# Patient Record
Sex: Male | Born: 1965 | Race: Black or African American | Hispanic: No | Marital: Married | Smoking: Current every day smoker
Health system: Southern US, Community
[De-identification: ages and names within clinical notes are randomized; demographics above are authoritative.]

## PROBLEM LIST (undated history)

## (undated) DIAGNOSIS — I10 Essential (primary) hypertension: Secondary | ICD-10-CM

## (undated) DIAGNOSIS — E782 Mixed hyperlipidemia: Secondary | ICD-10-CM

## (undated) DIAGNOSIS — E669 Obesity, unspecified: Secondary | ICD-10-CM

## (undated) DIAGNOSIS — K219 Gastro-esophageal reflux disease without esophagitis: Secondary | ICD-10-CM

## (undated) DIAGNOSIS — F17200 Nicotine dependence, unspecified, uncomplicated: Secondary | ICD-10-CM

## (undated) DIAGNOSIS — Z973 Presence of spectacles and contact lenses: Secondary | ICD-10-CM

## (undated) DIAGNOSIS — E785 Hyperlipidemia, unspecified: Secondary | ICD-10-CM

## (undated) DIAGNOSIS — E119 Type 2 diabetes mellitus without complications: Secondary | ICD-10-CM

## (undated) DIAGNOSIS — R809 Proteinuria, unspecified: Secondary | ICD-10-CM

## (undated) HISTORY — DX: Gastro-esophageal reflux disease without esophagitis: K21.9

## (undated) HISTORY — DX: Nicotine dependence, unspecified, uncomplicated: F17.200

## (undated) HISTORY — DX: Mixed hyperlipidemia: E78.2

## (undated) HISTORY — PX: WISDOM TOOTH EXTRACTION: SHX21

## (undated) HISTORY — DX: Obesity, unspecified: E66.9

## (undated) HISTORY — DX: Hyperlipidemia, unspecified: E78.5

## (undated) HISTORY — DX: Proteinuria, unspecified: R80.9

## (undated) HISTORY — DX: Presence of spectacles and contact lenses: Z97.3

---

## 2008-07-16 DIAGNOSIS — E119 Type 2 diabetes mellitus without complications: Secondary | ICD-10-CM

## 2008-07-16 DIAGNOSIS — I10 Essential (primary) hypertension: Secondary | ICD-10-CM

## 2008-07-16 HISTORY — DX: Type 2 diabetes mellitus without complications: E11.9

## 2008-07-16 HISTORY — DX: Essential (primary) hypertension: I10

## 2013-02-05 ENCOUNTER — Encounter (HOSPITAL_COMMUNITY): Payer: Self-pay | Admitting: Adult Health

## 2013-02-05 ENCOUNTER — Emergency Department (HOSPITAL_COMMUNITY)
Admission: EM | Admit: 2013-02-05 | Discharge: 2013-02-05 | Disposition: A | Discharge status: Home / Self Care | Payer: BC Managed Care – PPO | Attending: Emergency Medicine | Admitting: Emergency Medicine

## 2013-02-05 DIAGNOSIS — E119 Type 2 diabetes mellitus without complications: Secondary | ICD-10-CM | POA: Insufficient documentation

## 2013-02-05 DIAGNOSIS — Z79899 Other long term (current) drug therapy: Secondary | ICD-10-CM | POA: Insufficient documentation

## 2013-02-05 DIAGNOSIS — F17200 Nicotine dependence, unspecified, uncomplicated: Secondary | ICD-10-CM | POA: Insufficient documentation

## 2013-02-05 DIAGNOSIS — K089 Disorder of teeth and supporting structures, unspecified: Secondary | ICD-10-CM | POA: Insufficient documentation

## 2013-02-05 DIAGNOSIS — K0889 Other specified disorders of teeth and supporting structures: Secondary | ICD-10-CM

## 2013-02-05 HISTORY — DX: Type 2 diabetes mellitus without complications: E11.9

## 2013-02-05 MED ORDER — HYDROCODONE-ACETAMINOPHEN 5-325 MG PO TABS: 1.0000 | ORAL_TABLET | ORAL | Status: DC | PRN

## 2013-02-05 NOTE — ED Notes (Signed)
Presents post fillling this afternoon in upper left teeth, reports pain still.

## 2013-02-05 NOTE — ED Provider Notes (Signed)
CSN: [id number]     Arrival date & time 02/05/13  2000 History  This chart was scribed for Trixie Dredge, PA-C working with Leonette Most B. Bernette Mayers, MD by Greggory Stallion, ED scribe. This patient was seen in room TR07C/TR07C and the patient's care was started at 9:12 PM.   Chief Complaint  Patient presents with  . Dental Pain   The history is provided by the patient. No language interpreter was used.    HPI Comments: Jeffrey Rodriguez is a 47 y.o. male who presents to the Emergency Department complaining of gradual onset, constant left upper throbbing dental pain that started this afternoon after he had a filling. He rates his pain 6/10. Pt states the swelling went away but he still has the pain. He states he has tried Orajel and 800mg  ibuprofen with no relief. Pt denies fevers, trouble swallowing, SOB, and sore throat as associated symptoms. Dentist closed early today, he did not report the problem to her.  She did not give him a prescription for pain medication.   Dentist is Lorenza Burton DDS  Past Medical History  Diagnosis Date  . Diabetes mellitus without complication    History reviewed. No pertinent past surgical history. History reviewed. No pertinent family history. History  Substance Use Topics  . Smoking status: Current Every Day Smoker    Types: Cigarettes  . Smokeless tobacco: Not on file  . Alcohol Use: No    Review of Systems  Constitutional: Negative for fever.  HENT: Positive for dental problem. Negative for sore throat and trouble swallowing.   Respiratory: Negative for shortness of breath.     Allergies  Review of patient's allergies indicates no known allergies.  Home Medications   Current Outpatient Rx  Name  Route  Sig  Dispense  Refill  . glipiZIDE (GLUCOTROL XL) 10 MG 24 hr tablet   Oral   Take 10 mg by mouth 2 (two) times daily. Take twice a day per patient         . hydrochlorothiazide (HYDRODIURIL) 25 MG tablet   Oral   Take 25 mg by mouth daily.          Marland Kitchen lisinopril (PRINIVIL,ZESTRIL) 20 MG tablet   Oral   Take 20 mg by mouth daily.         . metFORMIN (GLUCOPHAGE) 500 MG tablet   Oral   Take 500 mg by mouth 2 (two) times daily with a meal.          BP 144/95  Pulse 98  Temp(Src) 98.8 F (37.1 C) (Oral)  Resp 18  SpO2 97%  Physical Exam  Nursing note and vitals reviewed. Constitutional: He appears well-developed and well-nourished. No distress.  HENT:  Mouth/Throat: Oropharynx is clear and moist. No oropharyngeal exudate.    Pulmonary/Chest: Effort normal.  Abdominal: Soft.  Lymphadenopathy:    He has no cervical adenopathy.  Neurological: He is alert.  Skin: He is not diaphoretic.    ED Course   Procedures (including critical care time)  DIAGNOSTIC STUDIES: Oxygen Saturation is 97% on RA, normal by my interpretation.    COORDINATION OF CARE: 9:29 PM-Discussed treatment plan which includes pain medication with pt at bedside and pt agreed to plan. Advised his pt to follow up with his dentist on Monday.   Labs Reviewed - No data to display No results found. 1. Pain, dental     MDM  Pt with dental work done today, d/c home without pain medication, continues to have throbbing  pain in tooth.  No e/o infection.  DEA database shows no presciptions in the past 6 months for this patient.  Pt d/c home with #10 norco and dental follow up. Discussed  findings, treatment, follow up  with patient.  Pt given return precautions.  Pt verbalizes understanding and agrees with plan.      I doubt any other EMC precluding discharge at this time including, but not necessarily limited to the following: dental abscess, deep space infection of head or neck    I personally performed the services described in this documentation, which was scribed in my presence. The recorded information has been reviewed and is accurate.   Trixie Dredge, PA-C 02/05/13 2205

## 2013-02-05 NOTE — ED Provider Notes (Signed)
Medical screening examination/treatment/procedure(s) were performed by non-physician practitioner and as supervising physician I was immediately available for consultation/collaboration.   Charles B. Bernette Mayers, MD 02/05/13 2322

## 2013-03-01 ENCOUNTER — Emergency Department (HOSPITAL_COMMUNITY)
Admission: EM | Admit: 2013-03-01 | Discharge: 2013-03-01 | Disposition: A | Discharge status: Home / Self Care | Payer: BC Managed Care – PPO | Source: Home / Self Care

## 2013-03-01 ENCOUNTER — Encounter (HOSPITAL_COMMUNITY): Payer: Self-pay | Admitting: *Deleted

## 2013-03-01 DIAGNOSIS — K089 Disorder of teeth and supporting structures, unspecified: Secondary | ICD-10-CM

## 2013-03-01 DIAGNOSIS — K0889 Other specified disorders of teeth and supporting structures: Secondary | ICD-10-CM

## 2013-03-01 HISTORY — DX: Essential (primary) hypertension: I10

## 2013-03-01 MED ORDER — CLINDAMYCIN HCL 150 MG PO CAPS: 150.0000 mg | ORAL_CAPSULE | Freq: Four times a day (QID) | ORAL | Status: DC

## 2013-03-01 MED ORDER — HYDROCODONE-ACETAMINOPHEN 5-325 MG PO TABS: 1.0000 | ORAL_TABLET | Freq: Four times a day (QID) | ORAL | Status: DC | PRN

## 2013-03-01 NOTE — ED Provider Notes (Signed)
Jeffrey Rodriguez is a 47 y.o. male who presents to Urgent Care today for dental pain. Patient has pain in his upper left tooth. He had a cavity filled on 8/4 however his pain persisted and this demonstrated him with clindamycin and hydrocodone. Patient notes that the clindamycin worked very well however he ran out of his antibiotics recently and his pain has returned. He is an appointment scheduled with the dentist soon for dental extraction versus root canal. He would like his antibiotics and pain medications refilled if possible. No fevers or chills nausea vomiting or diarrhea.   PMH reviewed. Healthy otherwise History  Substance Use Topics  . Smoking status: Former Smoker    Types: Cigarettes  . Smokeless tobacco: Not on file  . Alcohol Use: No   ROS as above Medications reviewed. No current facility-administered medications for this encounter.   Current Outpatient Prescriptions  Medication Sig Dispense Refill  . glipiZIDE (GLUCOTROL XL) 10 MG 24 hr tablet Take 10 mg by mouth 2 (two) times daily. Take twice a day per patient      . hydrochlorothiazide (HYDRODIURIL) 25 MG tablet Take 25 mg by mouth daily.      Marland Kitchen lisinopril (PRINIVIL,ZESTRIL) 20 MG tablet Take 20 mg by mouth daily.      . metFORMIN (GLUCOPHAGE) 500 MG tablet Take 500 mg by mouth 2 (two) times daily with a meal.      . clindamycin (CLEOCIN) 150 MG capsule Take 1 capsule (150 mg total) by mouth every 6 (six) hours.  80 capsule  0  . HYDROcodone-acetaminophen (NORCO/VICODIN) 5-325 MG per tablet Take 1 tablet by mouth every 6 (six) hours as needed for pain.  20 tablet  0    Exam:  BP 125/81  Pulse 86  Temp(Src) 98.5 F (36.9 C) (Oral)  Resp 18  SpO2 99% Gen: Well NAD HEENT: EOMI,  MMM, this seemed to the upper left with neighboring tooth with erythema  in the gumline. tender to palpation Lungs: CTABL Nl WOB Heart: RRR no MRG Abd: NABS, NT, ND Exts: Non edematous BL  LE, warm and well perfused.   No results found for  this or any previous visit (from the past 24 hour(s)). No results found.  Assessment and Plan: 47 y.o. male with dental pain.  Place patient back on antibiotics. Small amount of hydrocodone for pain. Followup with dentist ASAP. Discussed warning signs or symptoms. Please see discharge instructions. Patient expresses understanding.      Rodolph Bong, MD 03/01/13 [phone]

## 2013-03-01 NOTE — ED Notes (Signed)
C/O left upper toothache since Friday; finished clindamycin course on Friday following tooth filling 8/4 (placed on abx per dentist 8/6) due to continued pain.  Has been taking Advil and hydrocodone.

## 2013-06-09 ENCOUNTER — Other Ambulatory Visit: Payer: Self-pay | Admitting: Family Medicine

## 2013-07-30 ENCOUNTER — Other Ambulatory Visit: Payer: Self-pay | Admitting: Family Medicine

## 2013-08-27 ENCOUNTER — Other Ambulatory Visit: Payer: Self-pay | Admitting: Family Medicine

## 2013-08-30 ENCOUNTER — Other Ambulatory Visit: Payer: Self-pay | Admitting: Family Medicine

## 2013-11-13 DIAGNOSIS — R809 Proteinuria, unspecified: Secondary | ICD-10-CM

## 2013-11-13 HISTORY — DX: Proteinuria, unspecified: R80.9

## 2013-11-19 ENCOUNTER — Ambulatory Visit (INDEPENDENT_AMBULATORY_CARE_PROVIDER_SITE_OTHER): Payer: BC Managed Care – PPO | Admitting: Medical

## 2013-11-19 ENCOUNTER — Encounter: Payer: Self-pay | Admitting: Medical

## 2013-11-19 VITALS — BP 138/90 | HR 88 | Temp 98.5°F | Resp 18 | Ht 72.0 in | Wt 231.0 lb

## 2013-11-19 DIAGNOSIS — Z0000 Encounter for general adult medical examination without abnormal findings: Secondary | ICD-10-CM

## 2013-11-19 DIAGNOSIS — F17200 Nicotine dependence, unspecified, uncomplicated: Secondary | ICD-10-CM

## 2013-11-19 DIAGNOSIS — I10 Essential (primary) hypertension: Secondary | ICD-10-CM

## 2013-11-19 DIAGNOSIS — Z23 Encounter for immunization: Secondary | ICD-10-CM

## 2013-11-19 DIAGNOSIS — E119 Type 2 diabetes mellitus without complications: Secondary | ICD-10-CM

## 2013-11-19 DIAGNOSIS — Z1211 Encounter for screening for malignant neoplasm of colon: Secondary | ICD-10-CM

## 2013-11-19 DIAGNOSIS — J069 Acute upper respiratory infection, unspecified: Secondary | ICD-10-CM

## 2013-11-19 LAB — COMPREHENSIVE METABOLIC PANEL
ALBUMIN: 4.2 g/dL (ref 3.5–5.2)
ALT: 32 U/L (ref 0–53)
AST: 34 U/L (ref 0–37)
Alkaline Phosphatase: 88 U/L (ref 39–117)
BUN: 13 mg/dL (ref 6–23)
CALCIUM: 9.1 mg/dL (ref 8.4–10.5)
CHLORIDE: 106 meq/L (ref 96–112)
CO2: 27 meq/L (ref 19–32)
Creat: 0.95 mg/dL (ref 0.50–1.35)
GLUCOSE: 130 mg/dL — AB (ref 70–99)
POTASSIUM: 4 meq/L (ref 3.5–5.3)
SODIUM: 142 meq/L (ref 135–145)
TOTAL PROTEIN: 7.4 g/dL (ref 6.0–8.3)
Total Bilirubin: 0.4 mg/dL (ref 0.2–1.2)

## 2013-11-19 LAB — CBC
HEMATOCRIT: 42.2 % (ref 39.0–52.0)
HEMOGLOBIN: 15.3 g/dL (ref 13.0–17.0)
MCH: 28.9 pg (ref 26.0–34.0)
MCHC: 36.3 g/dL — AB (ref 30.0–36.0)
MCV: 79.8 fL (ref 78.0–100.0)
Platelets: 219 10*3/uL (ref 150–400)
RBC: 5.29 MIL/uL (ref 4.22–5.81)
RDW: 14.4 % (ref 11.5–15.5)
WBC: 7.4 10*3/uL (ref 4.0–10.5)

## 2013-11-19 LAB — POCT URINALYSIS DIPSTICK
BILIRUBIN UA: NEGATIVE
KETONES UA: NEGATIVE
LEUKOCYTES UA: NEGATIVE
Nitrite, UA: NEGATIVE
SPEC GRAV UA: 1.02
Urobilinogen, UA: NEGATIVE
pH, UA: 5

## 2013-11-19 LAB — LIPID PANEL
CHOL/HDL RATIO: 3.8 ratio
Cholesterol: 118 mg/dL (ref 0–200)
HDL: 31 mg/dL — ABNORMAL LOW (ref >39)
LDL Cholesterol: 44 mg/dL (ref 0–99)
Triglycerides: 215 mg/dL — ABNORMAL HIGH (ref <150)
VLDL: 43 mg/dL — ABNORMAL HIGH (ref 0–40)

## 2013-11-19 MED ORDER — HYDROCODONE-HOMATROPINE 5-1.5 MG/5ML PO SYRP: 5.0000 mL | ORAL_SOLUTION | Freq: Three times a day (TID) | ORAL | Status: DC | PRN

## 2013-11-19 NOTE — Patient Instructions (Signed)
  Thank you for giving me the opportunity to serve you today.    Your diagnosis today includes: Encounter Diagnoses  Name Primary?  . Routine general medical examination at a health care facility Yes  . Type II or unspecified type diabetes mellitus without mention of complication, not stated as uncontrolled   . Essential hypertension, benign   . Tobacco use disorder   . Need for prophylactic vaccination against Streptococcus pneumoniae (pneumococcus)   . Special screening for malignant neoplasms, colon      Specific recommendations today include:  Continue your current medications, we will call with lab results  When you are ready we can refer for your first screening colonoscopy after birth date  I recommend you stop tobacco  I recommend you exercise rectally, eating healthy diabetic diet  See dentist and eye doctor yearly  We updated your pneumococcal vaccine today  Return pending labs.    I have included other useful information below for your review.

## 2013-11-19 NOTE — Addendum Note (Signed)
Addended by: Louie Bun on: 11/19/2013 11:00 AM   Modules accepted: Orders

## 2013-11-19 NOTE — Progress Notes (Signed)
Subjective:   HPI  Jeffrey Rodriguez is a 48 y.o. male who presents for a complete physical.  New patient.  Was seeing [place] prior.   Preventative care: Last ophthalmology visit:Dr. Juanda Chance 2015 Last dental visit:2014 Dr. Earlie Lou Last colonoscopy:no Last prostate exam: no Last EKG:no Last labs:2015  Prior vaccinations: TD or Tdap: about 5 years ago. Influenza:2014 Pneumococcal: no Shingles/Zostavax:no  Advanced directive:no Health care power of attorney:no Living will:no  Concerns: Has a cold that started a few days ago.  Started with sneezing and congestion.  Had subjective fever.  No SOB, no wheezing.  +wife was sick recently with similar.    Last diabetes f/u 07/2013, had [facility] at that time, sugar numbness were fine then.  checks glucose BID, he sees under 100.  Currently taking Metformin 500mg  BID, Glipizide 10mg  once daily.    HTN - Only taking 10mg  Lisinopril and not taking HCTZ due to sweating.  Checks BPs and usually good.  He thinks its up today due to cough/cold.   Reviewed their medical, surgical, family, social, medication, and allergy history and updated chart as appropriate.  Past Medical History  Diagnosis Date  . Diabetes mellitus without complication 3976  . Hypertension 2010  . Wears glasses     History reviewed. No pertinent past surgical history.  History   Social History  . Marital Status: Married    Spouse Name: N/A    Number of Children: N/A  . Years of Education: N/A   Occupational History  . Not on file.   Social History Main Topics  . Smoking status: Current Every Day Smoker -- 0.50 packs/day for 20 years    Types: Cigarettes  . Smokeless tobacco: Not on file  . Alcohol Use: No  . Drug Use: No  . Sexual Activity: Not on file   Other Topics Concern  . Not on file   Social History Narrative   Lives with wife, works for [employer], exercise - walking    History reviewed. No pertinent  family history.  Current outpatient prescriptions:glipiZIDE (GLUCOTROL XL) 10 MG 24 hr tablet, Take 10 mg by mouth 2 (two) times daily. Take twice a day per patient, Disp: , Rfl: ;  lisinopril (PRINIVIL,ZESTRIL) 20 MG tablet, Take 20 mg by mouth daily., Disp: , Rfl: ;  metFORMIN (GLUCOPHAGE) 500 MG tablet, Take 500 mg by mouth 2 (two) times daily with a meal., Disp: , Rfl:  clindamycin (CLEOCIN) 150 MG capsule, Take 1 capsule (150 mg total) by mouth every 6 (six) hours., Disp: 80 capsule, Rfl: 0;  hydrochlorothiazide (HYDRODIURIL) 25 MG tablet, Take 25 mg by mouth daily., Disp: , Rfl: ;  HYDROcodone-acetaminophen (NORCO/VICODIN) 5-325 MG per tablet, Take 1 tablet by mouth every 6 (six) hours as needed for pain., Disp: 20 tablet, Rfl: 0  No Known Allergies     Review of Systems Constitutional: -fever, -chills, -+sweats, -unexpected weight change, -decreased appetite, -fatigue Allergy: +sneezing, -itching, -+congestion Dermatology: -changing moles, --rash, -lumps ENT: -runny nose, -ear pain, -sore throat, -hoarseness, -sinus pain, -teeth pain, - ringing in ears, -hearing loss, -nosebleeds Cardiology: -chest pain, -palpitations, -swelling, -difficulty breathing when lying flat, -waking up short of breath Respiratory: -+cough, -shortness of breath, -difficulty breathing with exercise or exertion, -wheezing, -coughing up blood Gastroenterology: -abdominal pain, -nausea, -vomiting, -diarrhea, -constipation, -blood in stool, -changes in bowel movement, -difficulty swallowing or eating Hematology: -bleeding, -bruising  Musculoskeletal: -joint aches, -muscle aches, -joint swelling, -back pain, -neck pain, -cramping, -changes  in gait Ophthalmology: denies vision changes, eye redness, itching, discharge Urology: -burning with urination, -difficulty urinating, -blood in urine, -urinary frequency, -urgency, -incontinence Neurology: -headache, -weakness, -tingling, -numbness, -memory loss, -falls,  -dizziness Psychology: -depressed mood, -agitation, -sleep problems     Objective:   Physical Exam  BP 138/90  Pulse 88  Temp(Src) 98.5 F (36.9 C) (Oral)  Resp 18  Ht 6' (1.829 m)  Wt 231 lb (104.781 kg)  BMI 31.32 kg/m2  General appearance: alert, no distress, WD/WN, AA male Skin:scattered benign appearing macules, bilat great toenail thickening HEENT: normocephalic, conjunctiva/corneas normal, sclerae anicteric, PERRLA, EOMi, nares patent, no discharge or erythema, pharynx normal Oral cavity: MMM, tongue normal, teeth in good repair Neck: supple, no lymphadenopathy, no thyromegaly, no masses, normal ROM, no bruits Chest: non tender, normal shape and expansion Heart: RRR, normal S1, S2, no murmurs Lungs: CTA bilaterally, no wheezes, rhonchi, or rales Abdomen: +bs, soft, non tender, non distended, no masses, no hepatomegaly, no splenomegaly, no bruits Back: non tender, normal ROM, no scoliosis Musculoskeletal: upper extremities non tender, no obvious deformity, normal ROM throughout, lower extremities non tender, no obvious deformity, normal ROM throughout Extremities: no edema, no cyanosis, no clubbing Pulses: 2+ symmetric, upper and lower extremities, normal cap refill Neurological: alert, oriented x 3, CN2-12 intact, strength normal upper extremities and lower extremities, sensation normal throughout, DTRs 2+ throughout, no cerebellar signs, gait normal Psychiatric: normal affect, behavior normal, pleasant  GU: normal male external genitalia, 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  . Routine general medical examination at a health care facility Yes  . Type II or  unspecified type diabetes mellitus without mention of complication, not stated as uncontrolled   . Essential hypertension, benign   . Tobacco use disorder   . Need for prophylactic vaccination against Streptococcus pneumoniae (pneumococcus)   . Special screening for malignant neoplasms, colon   . Upper respiratory infection     Physical exam - discussed healthy lifestyle, diet, exercise, preventative care, vaccinations, and addressed their concerns.    Specific recommendations today include:  Continue your current medications, we will call with lab results  When you are ready we can refer for your first screening colonoscopy after birth date  I recommend you stop tobacco  I recommend you exercise rectally, eating healthy diabetic diet  See dentist and eye doctor yearly  We updated your pneumococcal vaccine today   URI - hycodan for worse cough keeping him up at night, rest, hydrate well, call if worse or not improving.  Counseled on the pneumococcal vaccine.  Vaccine information sheet given.  Pneumococcal vaccine given after consent obtained.  Follow-up pending labs

## 2013-11-20 LAB — TSH: TSH: 1.475 u[IU]/mL (ref 0.350–4.500)

## 2013-11-20 LAB — MICROALBUMIN / CREATININE URINE RATIO
CREATININE, URINE: 341.6 mg/dL
MICROALB UR: 17.43 mg/dL — AB (ref 0.00–1.89)
MICROALB/CREAT RATIO: 51 mg/g — AB (ref 0.0–30.0)

## 2013-11-20 LAB — HEMOGLOBIN A1C
HEMOGLOBIN A1C: 7.1 % — AB (ref <5.7)
MEAN PLASMA GLUCOSE: 157 mg/dL — AB (ref <117)

## 2013-11-23 ENCOUNTER — Other Ambulatory Visit: Payer: Self-pay | Admitting: Medical

## 2013-11-23 MED ORDER — LISINOPRIL 20 MG PO TABS: 20.0000 mg | ORAL_TABLET | Freq: Every day | ORAL | Status: DC

## 2013-11-23 MED ORDER — SAXAGLIPTIN-METFORMIN ER 5-1000 MG PO TB24: 1.0000 | ORAL_TABLET | Freq: Every day | ORAL | Status: DC

## 2014-02-05 ENCOUNTER — Other Ambulatory Visit: Payer: Self-pay | Admitting: Medical

## 2014-02-05 ENCOUNTER — Telehealth: Payer: Self-pay | Admitting: Medical

## 2014-02-05 ENCOUNTER — Telehealth: Payer: Self-pay | Admitting: Internal Medicine

## 2014-02-05 ENCOUNTER — Other Ambulatory Visit: Payer: Self-pay | Admitting: Family Medicine

## 2014-02-05 MED ORDER — SAXAGLIPTIN-METFORMIN ER 5-1000 MG PO TB24: 1.0000 | ORAL_TABLET | Freq: Every day | ORAL | Status: DC

## 2014-02-05 MED ORDER — SITAGLIPTIN PHOS-METFORMIN HCL 50-1000 MG PO TABS: 1.0000 | ORAL_TABLET | Freq: Two times a day (BID) | ORAL | Status: DC

## 2014-02-05 NOTE — Telephone Encounter (Signed)
I sent Janumet instead since Kombiglyze XR is not covered. If it turns out that they don't cover Janumet either then he needs to check with the insurance formulary to see what medications are covered

## 2014-02-05 NOTE — Telephone Encounter (Signed)
Patient is aware and I left samples and a coupon up front for him to pick up. CLS

## 2014-02-05 NOTE — Telephone Encounter (Signed)
Juliann Pulse gave 2 boxes with 7 pills to pt.

## 2014-02-05 NOTE — Telephone Encounter (Signed)
Pt came in and stated that he went to pick up his kombiglyze xr. He states it is too expensive and he cant afford it. He would like to be changed to something less expensive. Pt scheduled an appt for end of aug. Pt was also given 14 days worth of smaples of kombliglyze xr. Please call pt and let him know what is options are.

## 2014-02-18 ENCOUNTER — Telehealth: Payer: Self-pay | Admitting: Medical

## 2014-02-18 NOTE — Telephone Encounter (Signed)
There are about 5 different similar medications on the market.   Have him call his insurer to ask for the formulary options for diabetes medications so we don't have to play cat and mouse with the medications.

## 2014-02-18 NOTE — Telephone Encounter (Signed)
Pt called and stated that janumet is too expensive. He was first given a rx for kombiglyze and that had a 80 dollar copay which he couldn't afford. He was then given janumet which also has a 80 dollar copay. Pt is requesting something else that he can afford.

## 2014-02-25 ENCOUNTER — Other Ambulatory Visit: Payer: Self-pay | Admitting: Medical

## 2014-02-25 MED ORDER — METFORMIN HCL 1000 MG PO TABS: 1000.0000 mg | ORAL_TABLET | Freq: Two times a day (BID) | ORAL | Status: DC

## 2014-02-25 MED ORDER — GLIMEPIRIDE 4 MG PO TABS: 4.0000 mg | ORAL_TABLET | Freq: Every day | ORAL | Status: DC

## 2014-02-25 NOTE — Telephone Encounter (Signed)
Home # is incorrect, and he is not available at the work # provided.

## 2014-02-25 NOTE — Telephone Encounter (Signed)
Called pt and asked if he had contacted his insurance concerning preferred diabetic drugs. Pt states that he is unsure of what to do. I informed pt I would locate. I have the preferred drug list provided by [facility]. I am sending back to you for review.

## 2014-02-25 NOTE — Telephone Encounter (Signed)
Since his insurance would not cover Janumet or similar medication, have him begin plain metformin 1000 twice daily and the Glimeperide 4 mg daily in the morning.  Monitor sugars, to make sure not running low such as under 70.  If he is running lower than 70, then drink a little orange juice to bring the number up and let me know about this if it happens.  Recheck in 59mo

## 2014-03-01 NOTE — Telephone Encounter (Signed)
Pt notified of shane recommendations and to know to recheck in 3 months

## 2014-03-01 NOTE — Telephone Encounter (Signed)
Phone # is correct. Please note it is a 334 area code not 336.

## 2014-03-11 ENCOUNTER — Ambulatory Visit (INDEPENDENT_AMBULATORY_CARE_PROVIDER_SITE_OTHER): Payer: BC Managed Care – PPO | Admitting: Medical

## 2014-03-11 ENCOUNTER — Encounter: Payer: Self-pay | Admitting: Medical

## 2014-03-11 VITALS — BP 124/80 | HR 78 | Temp 98.2°F | Resp 16 | Wt 223.0 lb

## 2014-03-11 DIAGNOSIS — I10 Essential (primary) hypertension: Secondary | ICD-10-CM

## 2014-03-11 DIAGNOSIS — E119 Type 2 diabetes mellitus without complications: Secondary | ICD-10-CM

## 2014-03-11 DIAGNOSIS — E782 Mixed hyperlipidemia: Secondary | ICD-10-CM

## 2014-03-11 DIAGNOSIS — R809 Proteinuria, unspecified: Secondary | ICD-10-CM

## 2014-03-11 DIAGNOSIS — Z23 Encounter for immunization: Secondary | ICD-10-CM

## 2014-03-11 DIAGNOSIS — F17200 Nicotine dependence, unspecified, uncomplicated: Secondary | ICD-10-CM

## 2014-03-11 LAB — POCT GLYCOSYLATED HEMOGLOBIN (HGB A1C): HEMOGLOBIN A1C: 7.2

## 2014-03-11 NOTE — Progress Notes (Signed)
Subjective: Here for f/u on diabetes, hypertension, labs from his recent physical in May when he came in as a new patient  Diabetes type II - been on Metformin and Glimeperide since running out of samples . He did end up taking Kombiglyze XR  11/998 mg daily for about a month and half before insurance denied and we had to go back to metformin + glimepiride. Execising, eating healthy. Not checking sugars as his machine takes too much blood according to him and gives inaccurate readings  HTN - compliant with lisinopril 20 mg without complaint  Mixed dyslipidemia-no prior medications, trying to eat healthy.  Not taking baby aspirin.  He is still smoking  Last diabetic eye exam was within the last year with Dr. Newman Nip  ROS as in subjective:  Past Medical History  Diagnosis Date  . Diabetes mellitus without complication 2440  . Hypertension 2010  . Wears glasses   . Microalbuminuria 11/2013  . GERD (gastroesophageal reflux disease)   . Obesity   . Smoker   . Mixed dyslipidemia      Objective: BP 124/80  Pulse 78  Temp(Src) 98.2 F (36.8 C) (Oral)  Resp 16  Wt 223 lb (101.152 kg)  General appearance: alert, no distress, WD/WN Neck: supple, no lymphadenopathy, no thyromegaly, no masses Heart: RRR, normal S1, S2, no murmurs Lungs: CTA bilaterally, no wheezes, rhonchi, or rales Pulses: 2+ symmetric, upper and lower extremities, normal cap refill Ext: no edema   Assessment: Encounter Diagnoses  Name Primary?  . Type II or unspecified type diabetes mellitus without mention of complication, not stated as uncontrolled Yes  . Essential hypertension, benign   . Mixed dyslipidemia   . Tobacco use disorder   . Microalbuminuria   . Need for prophylactic vaccination and inoculation against influenza      Plan: HgbA1C today 7.2%.    Specific recommendations today include:  Continue lisinopril 20 mg daily for blood pressure and kidney protection  Begin an 81 mg baby aspirin  at bedtime daily for heart health  We updated your influenza vaccine  We will try and get a copy of your last eye doctor visit from Dr. Einar Gip  I really want you to try and quit smoking  I want you to be careful with diet, continue exercise, try and lose a little weight  Make sure you're eating healthy fat such as almonds, avocado, fish, vegetables, oatmeal which are all good for cholesterol  For now continue the 2 diabetic medications metformin and Glimeperide, but after reviewing your chart I may end up changing the medications  We will call you back in the next few days in regard to diabetes medications  Your hemoglobin A1c could be a little better but it is stable  Let's plan to followup in 6 months otherwise

## 2014-03-11 NOTE — Patient Instructions (Signed)
Thank you for giving me the opportunity to serve you today.    Your diagnosis today includes: Encounter Diagnoses  Name Primary?  . Type II or unspecified type diabetes mellitus without mention of complication, not stated as uncontrolled Yes  . Essential hypertension, benign   . Mixed dyslipidemia   . Tobacco use disorder   . Microalbuminuria   . Need for prophylactic vaccination and inoculation against influenza      Specific recommendations today include:  Continue lisinopril 20 mg daily for blood pressure and kidney protection  Begin an 81 mg baby aspirin at bedtime daily for heart health  We updated your influenza vaccine  We will try and get a copy of your last eye doctor visit from Dr. Einar Gip  I really want you to try and quit smoking  I want you to be careful with diet, continue exercise, try and lose a little weight  Make sure you're eating healthy fat such as almonds, avocado, fish, vegetables, oatmeal which are all good for cholesterol  For now continue the 2 diabetic medications metformin and Glimeperide, but after reviewing your chart I may end up changing the medications  We will call you back in the next few days in regard to diabetes medications  Your hemoglobin A1c could be a little better but it is stable  Let's plan to followup in 6 months otherwise  Check your insurance coverage for screening colonoscopy and let me know when you want me to send you for the consult  Return 4-60mo.   I have included other useful information below for your review.   Microalbumin Microalbumin (MA) is a urine test which determines small amounts of albumin in the urine that are not normally detected with routine testing. This is an especially important test with diabetics because it determines the amount of blood sugar control over the years. It is one of the earlier findings in diabetics that may suggest complications are happening. This may include problems with  nerves, vessels, and development of high blood pressures. Microalbumin can be identified years before routine protein urine tests are able to identify it. When this is present, it may be an indicator of shortened life expectancy due to cardiovascular disease and hypertension.  PREPARATION FOR TEST No preparation or fasting is necessary. NORMAL FINDINGS  MA: 0.2-1.9 mg/dl  MA/creatinine ratio: 0-30 mg/g Ranges for normal findings may vary among different laboratories and hospitals. You should always check with your doctor after having lab work or other tests done to discuss the meaning of your test results and whether your values are considered within normal limits. MEANING OF TEST  Your caregiver will go over the test results with you and discuss the importance and meaning of your results, as well as treatment options and the need for additional tests if necessary. OBTAINING THE TEST RESULTS It is your responsibility to obtain your test results. Ask the lab or department performing the test when and how you will get your results. Document Released: 08/04/2004 Document Revised: 09/24/2011 Document Reviewed: 06/12/2008 [facility] Patient Information 2015 [place], [place]. This information is not intended to replace advice given to you by your health care provider. Make sure you discuss any questions you have with your health care provider.   Dyslipidemia Dyslipidemia is an imbalance of the lipids in your blood. Lipids are waxy, fat-like proteins that your body needs in small amounts. Dyslipidemia often involves the lipids cholesterol or triglycerides. Common forms of dyslipidemia are:  High levels of bad cholesterol (  LDL cholesterol). LDL cholesterol is the type of cholesterol that causes heart disease.  Low levels of good cholesterol (HDL cholesterol). HDL cholesterol is the type of cholesterol that helps protect against heart disease.  High levels of triglycerides. Triglycerides are a fatty  substance in the blood linked to a buildup of plaque on your arteries. RISK FACTORS  Increased age.  Having a family history of high cholesterol.  Certain medicines, including birth control pills, diuretics, beta-blockers, and some medicines for depression.  Smoking.  Eating a high-fat diet.  Being overweight.  Medical conditions such as diabetes, polycystic ovary syndrome, pregnancy, kidney disease, and hypothyroidism.  Lack of regular exercise. SIGNS AND SYMPTOMS There are no signs or symptoms with dyslipidemia.  DIAGNOSIS  A simple blood test called a fasting blood test can be done to determine your level of:  Total cholesterol. This is the combined number of LDL cholesterol and HDL cholesterol. A healthy number is lower than 200.  LDL cholesterol. The goal number for LDL cholesterol is different for each person depending on risk factors. Ask your health care provider what your LDL cholesterol number should be.  HDL cholesterol. A healthy level of HDL cholesterol is 60 or higher. A number lower than 40 for men or 50 for women is a danger sign.  Triglycerides. A healthy triglyceride number is less than 150. TREATMENT  Dyslipidemia is a treatable condition. Your health care provider will advise you on what type of treatment is best based on your age, your test results, and current guidelines. Treatment may include:   Dietary changes. A dietitian can help you create a meal plan. You may need to:  Eat more foods that contain omega-3s, such as salmon and other fish.  Replace saturated fats and trans fats in your diet with healthy fats such as nuts, seeds, avocados, olive oil, and canola oil.  Regular exercise. This can help lower your LDL cholesterol, raise your HDL cholesterol, and help with weight management. Check with your health care provider before beginning an exercise program. Most people should participate in 30 minutes of brisk exercise 5 days a week.  Quitting  smoking.  Medicines to lower LDL cholesterol and triglycerides. Your health care provider will monitor your lipid levels with regular blood tests. HOME CARE INSTRUCTIONS  Eat a healthy diet. Follow any diet instructions if they were given to you by your health care provider.  Maintain a healthy weight.  Exercise regularly based on the recommendations of your health care provider.  Do not use any tobacco products, including cigarettes, chewing tobacco, or electronic cigarettes.  Take medicines only as directed by your health care provider.  Keep all follow-up visits as directed by your health care provider. SEEK MEDICAL CARE IF: You are having possible side effects from your medicines. Document Released: 07/07/2013 Document Revised: 11/16/2013 Document Reviewed: 07/07/2013 [facility] Patient Information 2015 [place], [place]. This information is not intended to replace advice given to you by your health care provider. Make sure you discuss any questions you have with your health care provider.

## 2014-03-11 NOTE — Addendum Note (Signed)
Addended by: Louie Bun on: 03/11/2014 09:26 AM   Modules accepted: Orders

## 2014-03-16 ENCOUNTER — Other Ambulatory Visit: Payer: Self-pay | Admitting: Medical

## 2014-03-16 ENCOUNTER — Telehealth: Payer: Self-pay | Admitting: Medical

## 2014-03-16 MED ORDER — SITAGLIPTIN PHOSPHATE 50 MG PO TABS: 50.0000 mg | ORAL_TABLET | Freq: Every day | ORAL | Status: DC

## 2014-03-16 NOTE — Telephone Encounter (Signed)
Printed & activated coupon card for [facility] had them rerun with coupon card & cost is $5 a month.  Called pt no answer

## 2014-03-16 NOTE — Telephone Encounter (Signed)
[place] did go thru on insurance but cost is $80, does not require a P.A. Called rep and left message to see if there is a coupon card to help with cost

## 2014-03-17 NOTE — Telephone Encounter (Signed)
Pt informed of new medication.  He wants to know if Januvia is in addition to other meds or does he discontinue any?

## 2014-03-18 NOTE — Telephone Encounter (Signed)
Yes in addition to the other medications.  Check glucose a few mornings per week to make sure glucose not less than 70 or symptoms of hypoglycemia such as sweats, blurred vision, weakness feeling.

## 2014-03-18 NOTE — Telephone Encounter (Signed)
Pt informed and he will call if has any concerns or problems with blood sugar

## 2014-03-30 ENCOUNTER — Encounter: Payer: Self-pay | Admitting: Medical

## 2014-04-22 ENCOUNTER — Telehealth: Payer: Self-pay | Admitting: Internal Medicine

## 2014-04-22 NOTE — Telephone Encounter (Signed)
Pt does not want to use [facility] out of [place] for his testing supplies as he already has a device that he will use

## 2014-07-02 ENCOUNTER — Other Ambulatory Visit: Payer: Self-pay | Admitting: Family Medicine

## 2014-07-02 MED ORDER — METFORMIN HCL 1000 MG PO TABS: 1000.0000 mg | ORAL_TABLET | Freq: Two times a day (BID) | ORAL | Status: DC

## 2014-07-02 MED ORDER — SITAGLIPTIN PHOSPHATE 50 MG PO TABS: 50.0000 mg | ORAL_TABLET | Freq: Every day | ORAL | Status: DC

## 2014-07-02 MED ORDER — GLIMEPIRIDE 4 MG PO TABS: 4.0000 mg | ORAL_TABLET | Freq: Every day | ORAL | Status: DC

## 2014-07-06 ENCOUNTER — Telehealth: Payer: Self-pay | Admitting: Medical

## 2014-07-06 NOTE — Telephone Encounter (Signed)
Pt called back and pharmacy advised him that he was trying to refill med too soon and if he refill after 2days med will be cheaper

## 2014-07-06 NOTE — Telephone Encounter (Signed)
Pt says Januvia is costing $100 after insurance this time when he is getting refill. He can not afford this

## 2014-07-07 NOTE — Telephone Encounter (Signed)
I'm not sure what the question is...

## 2014-07-28 ENCOUNTER — Other Ambulatory Visit: Payer: Self-pay | Admitting: Family Medicine

## 2014-08-12 ENCOUNTER — Ambulatory Visit (INDEPENDENT_AMBULATORY_CARE_PROVIDER_SITE_OTHER): Payer: BLUE CROSS/BLUE SHIELD | Admitting: Medical

## 2014-08-12 ENCOUNTER — Telehealth: Payer: Self-pay | Admitting: Medical

## 2014-08-12 ENCOUNTER — Encounter: Payer: Self-pay | Admitting: Medical

## 2014-08-12 VITALS — BP 150/102 | HR 82 | Temp 98.4°F | Resp 16 | Ht 72.0 in | Wt 226.0 lb

## 2014-08-12 DIAGNOSIS — E119 Type 2 diabetes mellitus without complications: Secondary | ICD-10-CM

## 2014-08-12 DIAGNOSIS — I10 Essential (primary) hypertension: Secondary | ICD-10-CM

## 2014-08-12 DIAGNOSIS — L602 Onychogryphosis: Secondary | ICD-10-CM

## 2014-08-12 DIAGNOSIS — L84 Corns and callosities: Secondary | ICD-10-CM

## 2014-08-12 DIAGNOSIS — E785 Hyperlipidemia, unspecified: Secondary | ICD-10-CM

## 2014-08-12 DIAGNOSIS — R809 Proteinuria, unspecified: Secondary | ICD-10-CM

## 2014-08-12 DIAGNOSIS — Q845 Enlarged and hypertrophic nails: Secondary | ICD-10-CM

## 2014-08-12 LAB — LIPID PANEL
Cholesterol: 109 mg/dL (ref 0–200)
HDL: 37 mg/dL — ABNORMAL LOW
LDL Cholesterol: 52 mg/dL (ref 0–99)
Total CHOL/HDL Ratio: 2.9 ratio
Triglycerides: 102 mg/dL
VLDL: 20 mg/dL (ref 0–40)

## 2014-08-12 LAB — COMPREHENSIVE METABOLIC PANEL WITH GFR
ALT: 26 U/L (ref 0–53)
AST: 25 U/L (ref 0–37)
Albumin: 4.3 g/dL (ref 3.5–5.2)
Alkaline Phosphatase: 85 U/L (ref 39–117)
BUN: 12 mg/dL (ref 6–23)
CO2: 28 meq/L (ref 19–32)
Calcium: 9.3 mg/dL (ref 8.4–10.5)
Chloride: 103 meq/L (ref 96–112)
Creat: 0.83 mg/dL (ref 0.50–1.35)
Glucose, Bld: 94 mg/dL (ref 70–99)
Potassium: 3.6 meq/L (ref 3.5–5.3)
Sodium: 137 meq/L (ref 135–145)
Total Bilirubin: 0.5 mg/dL (ref 0.2–1.2)
Total Protein: 7.2 g/dL (ref 6.0–8.3)

## 2014-08-12 LAB — POCT URINALYSIS DIPSTICK
BILIRUBIN UA: NEGATIVE
Blood, UA: NEGATIVE
GLUCOSE UA: NEGATIVE
LEUKOCYTES UA: NEGATIVE
NITRITE UA: NEGATIVE
Protein, UA: NEGATIVE
Urobilinogen, UA: NEGATIVE
pH, UA: 6

## 2014-08-12 LAB — POCT GLYCOSYLATED HEMOGLOBIN (HGB A1C): Hemoglobin A1C: 6.2

## 2014-08-12 MED ORDER — METFORMIN HCL 1000 MG PO TABS: 1000.0000 mg | ORAL_TABLET | Freq: Two times a day (BID) | ORAL | Status: DC

## 2014-08-12 MED ORDER — LISINOPRIL 20 MG PO TABS: 20.0000 mg | ORAL_TABLET | Freq: Every day | ORAL | Status: DC

## 2014-08-12 MED ORDER — GLIMEPIRIDE 4 MG PO TABS: 4.0000 mg | ORAL_TABLET | Freq: Every day | ORAL | Status: DC

## 2014-08-12 NOTE — Telephone Encounter (Signed)
Referral to podiatry  

## 2014-08-12 NOTE — Addendum Note (Signed)
Addended by: Louie Bun on: 08/12/2014 10:01 AM   Modules accepted: Orders

## 2014-08-12 NOTE — Telephone Encounter (Signed)
Faxed last visit and demographic info to [facility] per [place]. They say they will call patient for appointment.

## 2014-08-12 NOTE — Progress Notes (Signed)
Subjective: Here for diabetic follow-up, med check on chronic issues.    Diabetes type 2 He reports eating a healthy diabetic diet, getting some routine exercise, compliant with metformin 1000 milligrams twice daily, Glimepiride 4 mg daily.  Unable to get Januvia due to cost and insurance.  Checking feet daily, has some callus and thickened nails, no prior podiatry visit, last eye Dr. visit right at a year ago with Dr. Einar Gip. No other concerns, no hypoglycemia  Hypertension-compliant with lisinopril daily, gets normal readings at home despite the high reading today  Dyslipidemia-diet controlled, fasting today  He has a form for work for biometric screen that needs to be turned in  No other complaint  Past Medical History  Diagnosis Date  . Diabetes mellitus without complication 2505  . Hypertension 2010  . Wears glasses   . Microalbuminuria 11/2013  . GERD (gastroesophageal reflux disease)   . Obesity   . Smoker   . Mixed dyslipidemia     ROS as in subjective  Objective: BP 150/102 mmHg  Pulse 82  Temp(Src) 98.4 F (36.9 C) (Oral)  Resp 16  Ht 6' (1.829 m)  Wt 226 lb (102.513 kg)  BMI 30.64 kg/m2  General appearance: alert, no distress, WD/WN  Oral cavity: MMM, no lesions Neck: supple, no lymphadenopathy, no thyromegaly, no masses, no bruits Heart: RRR, normal S1, S2, no murmurs Lungs: CTA bilaterally, no wheezes, rhonchi, or rales Pulses: 2+ symmetric, upper and lower extremities, normal cap refill Ext: no edema See sepearate foot exam    Assessment: Encounter Diagnoses  Name Primary?  . Diabetes type 2, controlled Yes  . Essential hypertension   . Microalbuminuria   . Dyslipidemia   . Corn or callus   . Thickened nail      Plan: Diabetes type 2 Hemoglobin A1c 6.2% today, continue metformin 1000 mg twice daily, Glimepiride 4 mg daily, continue healthy diabetic diet, exercise regular, daily foot checks, see eye doctor soon for yearly  follow-up  Vaccines up-to-date, last tetanus within 5 years up-to-date on pneumococcal and influenza vaccines  Hypertension-elevated today but normal readings per his report at home-continue current medication. I asked him to get me copies of blood pressure readings in the next few weeks from home  Microalbuminuria-continue ACE inhibitor  Dyslipidemia-repeat labs today  Callus and thickened nails-referral to podiatry for baseline podiatry diabetic evaluation  Follow-up pending labs.  Will complete his form pending labs

## 2014-08-13 ENCOUNTER — Other Ambulatory Visit: Payer: Self-pay | Admitting: Medical

## 2014-08-13 MED ORDER — ATORVASTATIN CALCIUM 20 MG PO TABS: 20.0000 mg | ORAL_TABLET | Freq: Every day | ORAL | Status: DC

## 2014-09-06 ENCOUNTER — Telehealth: Payer: Self-pay | Admitting: Family Medicine

## 2014-09-06 NOTE — Telephone Encounter (Signed)
Patient rescheduled his appointment at [facility] [address] [place],  [address] [phone] 09/17/14 @ 920 AM

## 2014-09-17 ENCOUNTER — Telehealth: Payer: Self-pay | Admitting: Family Medicine

## 2014-09-17 ENCOUNTER — Telehealth: Payer: Self-pay | Admitting: Internal Medicine

## 2014-09-17 ENCOUNTER — Encounter (INDEPENDENT_AMBULATORY_CARE_PROVIDER_SITE_OTHER): Payer: BLUE CROSS/BLUE SHIELD | Admitting: Ophthalmology

## 2014-09-17 DIAGNOSIS — H43813 Vitreous degeneration, bilateral: Secondary | ICD-10-CM

## 2014-09-17 DIAGNOSIS — E11329 Type 2 diabetes mellitus with mild nonproliferative diabetic retinopathy without macular edema: Secondary | ICD-10-CM | POA: Diagnosis not present

## 2014-09-17 DIAGNOSIS — H34831 Tributary (branch) retinal vein occlusion, right eye: Secondary | ICD-10-CM | POA: Diagnosis not present

## 2014-09-17 DIAGNOSIS — H35033 Hypertensive retinopathy, bilateral: Secondary | ICD-10-CM | POA: Diagnosis not present

## 2014-09-17 DIAGNOSIS — H2513 Age-related nuclear cataract, bilateral: Secondary | ICD-10-CM

## 2014-09-17 DIAGNOSIS — I10 Essential (primary) hypertension: Secondary | ICD-10-CM

## 2014-09-17 DIAGNOSIS — E11319 Type 2 diabetes mellitus with unspecified diabetic retinopathy without macular edema: Secondary | ICD-10-CM | POA: Diagnosis not present

## 2014-09-17 NOTE — Telephone Encounter (Signed)
He is not in any immediate danger from his blood pressure being at this level. Explain that it would take years of elevated blood pressure at this level and do harm. Have him set up an appointment in a month

## 2014-09-17 NOTE — Telephone Encounter (Signed)
I have called pt number provided and phone is not working tried to call his work phone busy

## 2014-09-17 NOTE — Telephone Encounter (Signed)
I received a fax from [facility] that the patient had an appointment on 09/02/2014 @ 920 am, then he rescheduled to 09/17/2014 and now he as cancelled that appointment and he didn't reschedule.

## 2014-09-17 NOTE — Telephone Encounter (Signed)
Pt called stating when he was in the eye doctor getting an eye exam done Monday & his bp was 140/103. He was not having any headaches or abnormal symptoms that his bp was high. He has not been keep a record of his bp at home like Jeffrey Rodriguez advised when he was here in January cause he does not have a cuff. He has not made changes to his diet since he has DM and HTN as well. He wants to know what he needs to do about his bp being high? He was offered and appt for Monday but is having an eye procedure done and has already missed a couple days of work and did not want to come in. He has not checked his bp since Monday. Please advise pt on what to do? And what signs and symptoms to look for, for high bp

## 2014-09-20 ENCOUNTER — Encounter (INDEPENDENT_AMBULATORY_CARE_PROVIDER_SITE_OTHER): Payer: BLUE CROSS/BLUE SHIELD | Admitting: Ophthalmology

## 2014-09-20 NOTE — Telephone Encounter (Signed)
See other recent message, may need to send him a mailing about the 2 issues since he cannot be reached by phone (podiatry and high blood pressure)

## 2014-09-20 NOTE — Telephone Encounter (Signed)
Left a detailed message of what Dr. Redmond School said on pt's phone and to call back and set up an appt  In a month and also bring readings that way we can see what is bp readings are

## 2014-10-27 ENCOUNTER — Encounter (INDEPENDENT_AMBULATORY_CARE_PROVIDER_SITE_OTHER): Payer: BLUE CROSS/BLUE SHIELD | Admitting: Ophthalmology

## 2014-10-27 DIAGNOSIS — H43813 Vitreous degeneration, bilateral: Secondary | ICD-10-CM | POA: Diagnosis not present

## 2014-10-27 DIAGNOSIS — H35033 Hypertensive retinopathy, bilateral: Secondary | ICD-10-CM | POA: Diagnosis not present

## 2014-10-27 DIAGNOSIS — H34831 Tributary (branch) retinal vein occlusion, right eye: Secondary | ICD-10-CM | POA: Diagnosis not present

## 2014-10-27 DIAGNOSIS — H2513 Age-related nuclear cataract, bilateral: Secondary | ICD-10-CM | POA: Diagnosis not present

## 2014-10-27 DIAGNOSIS — H3531 Nonexudative age-related macular degeneration: Secondary | ICD-10-CM

## 2014-10-27 DIAGNOSIS — I10 Essential (primary) hypertension: Secondary | ICD-10-CM

## 2014-12-15 ENCOUNTER — Encounter (INDEPENDENT_AMBULATORY_CARE_PROVIDER_SITE_OTHER): Payer: BLUE CROSS/BLUE SHIELD | Admitting: Ophthalmology

## 2014-12-16 ENCOUNTER — Encounter (INDEPENDENT_AMBULATORY_CARE_PROVIDER_SITE_OTHER): Payer: BLUE CROSS/BLUE SHIELD | Admitting: Ophthalmology

## 2014-12-16 DIAGNOSIS — H2513 Age-related nuclear cataract, bilateral: Secondary | ICD-10-CM

## 2014-12-16 DIAGNOSIS — H34831 Tributary (branch) retinal vein occlusion, right eye: Secondary | ICD-10-CM | POA: Diagnosis not present

## 2014-12-16 DIAGNOSIS — I10 Essential (primary) hypertension: Secondary | ICD-10-CM

## 2014-12-16 DIAGNOSIS — E11329 Type 2 diabetes mellitus with mild nonproliferative diabetic retinopathy without macular edema: Secondary | ICD-10-CM | POA: Diagnosis not present

## 2014-12-16 DIAGNOSIS — E11319 Type 2 diabetes mellitus with unspecified diabetic retinopathy without macular edema: Secondary | ICD-10-CM

## 2014-12-16 DIAGNOSIS — H3531 Nonexudative age-related macular degeneration: Secondary | ICD-10-CM

## 2014-12-16 DIAGNOSIS — H35033 Hypertensive retinopathy, bilateral: Secondary | ICD-10-CM

## 2014-12-16 DIAGNOSIS — H43813 Vitreous degeneration, bilateral: Secondary | ICD-10-CM

## 2014-12-22 ENCOUNTER — Telehealth: Payer: Self-pay | Admitting: Medical

## 2014-12-22 NOTE — Telephone Encounter (Signed)
LMTCB with pt. Mother Edrick Kins. (For medication pick up ) 6-8 LJ

## 2015-02-10 ENCOUNTER — Encounter (INDEPENDENT_AMBULATORY_CARE_PROVIDER_SITE_OTHER): Payer: BLUE CROSS/BLUE SHIELD | Admitting: Ophthalmology

## 2015-03-09 ENCOUNTER — Ambulatory Visit (INDEPENDENT_AMBULATORY_CARE_PROVIDER_SITE_OTHER): Payer: BLUE CROSS/BLUE SHIELD | Admitting: Family Medicine

## 2015-03-09 ENCOUNTER — Encounter: Payer: Self-pay | Admitting: Family Medicine

## 2015-03-09 ENCOUNTER — Other Ambulatory Visit: Payer: Self-pay | Admitting: Medical

## 2015-03-09 ENCOUNTER — Ambulatory Visit
Admission: RE | Admit: 2015-03-09 | Discharge: 2015-03-09 | Disposition: A | Discharge status: Home / Self Care | Payer: BLUE CROSS/BLUE SHIELD | Source: Ambulatory Visit | Attending: Family Medicine | Admitting: Family Medicine

## 2015-03-09 VITALS — BP 140/90 | HR 68 | Temp 98.4°F | Wt 226.6 lb

## 2015-03-09 DIAGNOSIS — R030 Elevated blood-pressure reading, without diagnosis of hypertension: Secondary | ICD-10-CM | POA: Diagnosis not present

## 2015-03-09 DIAGNOSIS — IMO0001 Reserved for inherently not codable concepts without codable children: Secondary | ICD-10-CM

## 2015-03-09 DIAGNOSIS — M25512 Pain in left shoulder: Secondary | ICD-10-CM | POA: Diagnosis not present

## 2015-03-09 DIAGNOSIS — Z720 Tobacco use: Secondary | ICD-10-CM

## 2015-03-09 DIAGNOSIS — F17200 Nicotine dependence, unspecified, uncomplicated: Secondary | ICD-10-CM

## 2015-03-09 MED ORDER — IBUPROFEN 800 MG PO TABS: 800.0000 mg | ORAL_TABLET | Freq: Three times a day (TID) | ORAL | Status: DC

## 2015-03-09 NOTE — Progress Notes (Signed)
Called and left message for pt to go and get xray done at [facility]. Order for shoulder xray is in computer

## 2015-03-09 NOTE — Patient Instructions (Signed)
Take Ibuprofen 800 mg 3 times daily as prescribed. Try heat for 20 minutes 2-3 times daily and stretches. Let us know in a couple of weeks if no improvement.   Shoulder Pain The shoulder is the joint that connects your arms to your body. The bones that form the shoulder joint include the upper arm bone (humerus), the shoulder blade (scapula), and the collarbone (clavicle). The top of the humerus is shaped like a ball and fits into a rather flat socket on the scapula (glenoid cavity). A combination of muscles and strong, fibrous tissues that connect muscles to bones (tendons) support your shoulder joint and hold the ball in the socket. Small, fluid-filled sacs (bursae) are located in different areas of the joint. They act as cushions between the bones and the overlying soft tissues and help reduce friction between the gliding tendons and the bone as you move your arm. Your shoulder joint allows a wide range of motion in your arm. This range of motion allows you to do things like scratch your back or throw a ball. However, this range of motion also makes your shoulder more prone to pain from overuse and injury. Causes of shoulder pain can originate from both injury and overuse and usually can be grouped in the following four categories:  Redness, swelling, and pain (inflammation) of the tendon (tendinitis) or the bursae (bursitis).  Instability, such as a dislocation of the joint.  Inflammation of the joint (arthritis).  Broken bone (fracture). HOME CARE INSTRUCTIONS   Apply ice to the sore area.  Put ice in a plastic bag.  Place a towel between your skin and the bag.  Leave the ice on for 15-20 minutes, 3-4 times per day for the first 2 days, or as directed by your health care provider.  Stop using cold packs if they do not help with the pain.  If you have a shoulder sling or immobilizer, wear it as long as your caregiver instructs. Only remove it to shower or bathe. Move your arm as little  as possible, but keep your hand moving to prevent swelling.  Squeeze a soft ball or foam pad as much as possible to help prevent swelling.  Only take over-the-counter or prescription medicines for pain, discomfort, or fever as directed by your caregiver. SEEK MEDICAL CARE IF:   Your shoulder pain increases, or new pain develops in your arm, hand, or fingers.  Your hand or fingers become cold and numb.  Your pain is not relieved with medicines. SEEK IMMEDIATE MEDICAL CARE IF:   Your arm, hand, or fingers are numb or tingling.  Your arm, hand, or fingers are significantly swollen or turn white or blue. MAKE SURE YOU:   Understand these instructions.  Will watch your condition.  Will get help right away if you are not doing well or get worse. Document Released: 04/11/2005 Document Revised: 11/16/2013 Document Reviewed: 06/16/2011 [facility] Patient Information 2015 [place], [place]. This information is not intended to replace advice given to you by your health care provider. Make sure you discuss any questions you have with your health care provider.

## 2015-03-09 NOTE — Progress Notes (Signed)
   Subjective:    Patient ID: Jeffrey Rodriguez, male    DOB: 12-12-1965, 49 y.o.   MRN: [id number]  HPI He presents with a gradual onset of left shoulder pain for at least 2 months that has progressively gotten worse. The pain is nonradiating He denies injury or surgery to the shoulder. The pain is worse with movement or trying to sleep on it. He describes the pain as dull and occasionally a throbbing ache. He states occasionally he can feel it pop with certain movements. 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 This has not affected his job or daily activities but he states he is experiencing more pain than when it first started. He has tried intermittent use of over-the-counter pain meds. He denies fever, chills, nausea, vomiting, diarrhea, pain in any other joints. He is a smoker. He has a history of hypertension and takes medication for this, his blood pressure is elevated today and he states he has not taken his blood pressure medicine yet.   Review of Systems    pertinent positives and negatives in the history of present illness. Objective:   Physical Exam  Constitutional: He appears well-developed and well-nourished. No distress.  Neck: Normal range of motion. Neck supple.  Pulmonary/Chest: Effort normal and breath sounds normal.  Musculoskeletal:       Left shoulder: He exhibits pain and decreased strength. He exhibits normal range of motion, no tenderness, no swelling and no deformity.       Arms:         Assessment & Plan:  Pain in joint, shoulder region, left - Plan: ibuprofen (ADVIL,MOTRIN) 800 MG tablet, DG Shoulder Left  Smoker  Elevated blood pressure  Advised him to take ibuprofen 800 mg 3 times a day for the next 2 weeks, with food and use heat 20 minutes 2-3 times per day along with some stretching of his left shoulder. Will get an x-ray and follow-up pending x-ray results. he will let us know how he is feeling after 2 weeks of  conservative therapy. Discussed that his blood pressure is elevated today and he states that he normally does not take his blood pressure medication until around noon.

## 2015-03-22 ENCOUNTER — Telehealth: Payer: Self-pay | Admitting: Family Medicine

## 2015-03-22 NOTE — Telephone Encounter (Signed)
Pt is feeling alittle bit better but it still hurts. Pt is coming in on sept 12 @ 2:45 for shoulder injection

## 2015-03-22 NOTE — Telephone Encounter (Signed)
Pt called for refill of ibuprofen. Please send to [facility], pt can be reached at [phone].

## 2015-03-22 NOTE — Telephone Encounter (Signed)
Please call and ask how is shoulder is doing, is he any better? I do not expect him to be on Ibuprofen chronically, especially if he is not improving. We may consider alternative therapies if he isn't any better. Thanks.

## 2015-03-23 ENCOUNTER — Encounter (INDEPENDENT_AMBULATORY_CARE_PROVIDER_SITE_OTHER): Payer: BLUE CROSS/BLUE SHIELD | Admitting: Ophthalmology

## 2015-03-23 DIAGNOSIS — H34813 Central retinal vein occlusion, bilateral: Secondary | ICD-10-CM | POA: Diagnosis not present

## 2015-03-23 DIAGNOSIS — H35033 Hypertensive retinopathy, bilateral: Secondary | ICD-10-CM | POA: Diagnosis not present

## 2015-03-23 DIAGNOSIS — E11329 Type 2 diabetes mellitus with mild nonproliferative diabetic retinopathy without macular edema: Secondary | ICD-10-CM

## 2015-03-23 DIAGNOSIS — H34831 Tributary (branch) retinal vein occlusion, right eye: Secondary | ICD-10-CM | POA: Diagnosis not present

## 2015-03-23 DIAGNOSIS — E11319 Type 2 diabetes mellitus with unspecified diabetic retinopathy without macular edema: Secondary | ICD-10-CM

## 2015-03-23 DIAGNOSIS — I10 Essential (primary) hypertension: Secondary | ICD-10-CM

## 2015-03-23 DIAGNOSIS — H3531 Nonexudative age-related macular degeneration: Secondary | ICD-10-CM

## 2015-03-28 ENCOUNTER — Ambulatory Visit (INDEPENDENT_AMBULATORY_CARE_PROVIDER_SITE_OTHER): Payer: BLUE CROSS/BLUE SHIELD | Admitting: Family Medicine

## 2015-03-28 VITALS — BP 124/88 | HR 80 | Wt 231.0 lb

## 2015-03-28 DIAGNOSIS — M7582 Other shoulder lesions, left shoulder: Secondary | ICD-10-CM | POA: Diagnosis not present

## 2015-03-28 MED ORDER — LIDOCAINE HCL (PF) 1 % IJ SOLN
2.0000 mL | Freq: Once | INTRAMUSCULAR | Status: AC
  Administered 2015-05-27: 2 mL via INTRADERMAL

## 2015-03-28 MED ORDER — TRIAMCINOLONE ACETONIDE 40 MG/ML IJ SUSP
40.0000 mg | Freq: Once | INTRAMUSCULAR | Status: AC
  Administered 2015-05-27: 40 mg via INTRAMUSCULAR

## 2015-03-28 NOTE — Progress Notes (Signed)
   Subjective:    Patient ID: Jeffrey Rodriguez, male    DOB: 1965/08/24, 49 y.o.   MRN: [id number]  HPI He is here for reevaluation of left shoulder pain. He does note that abduction and external rotation does cause difficulty. Recently he has had an x-ray which was negative. He is also noted some nighttime pain.   Review of Systems     Objective:   Physical Exam Full motion of the shoulder with pain with abduction as well as external rotation. Supraspinatus testing did cause pain. No laxity is noted. Negative sulcus test.       Assessment & Plan:  Rotator cuff tendinitis, left Discussed options with him. He would like to have an injection. 40 mg of Kenalog and 3 mL of Xylocaine was injected into the subacromial bursa without difficulty. He did obtain relatively quick relief of his symptoms. He is to return here if continued difficulty.

## 2015-05-18 ENCOUNTER — Encounter (INDEPENDENT_AMBULATORY_CARE_PROVIDER_SITE_OTHER): Payer: BLUE CROSS/BLUE SHIELD | Admitting: Ophthalmology

## 2015-05-23 ENCOUNTER — Encounter (INDEPENDENT_AMBULATORY_CARE_PROVIDER_SITE_OTHER): Payer: BLUE CROSS/BLUE SHIELD | Admitting: Ophthalmology

## 2015-05-23 DIAGNOSIS — H348312 Tributary (branch) retinal vein occlusion, right eye, stable: Secondary | ICD-10-CM | POA: Diagnosis not present

## 2015-05-23 DIAGNOSIS — I10 Essential (primary) hypertension: Secondary | ICD-10-CM

## 2015-05-23 DIAGNOSIS — E113291 Type 2 diabetes mellitus with mild nonproliferative diabetic retinopathy without macular edema, right eye: Secondary | ICD-10-CM | POA: Diagnosis not present

## 2015-05-23 DIAGNOSIS — E11319 Type 2 diabetes mellitus with unspecified diabetic retinopathy without macular edema: Secondary | ICD-10-CM

## 2015-05-23 DIAGNOSIS — H35033 Hypertensive retinopathy, bilateral: Secondary | ICD-10-CM | POA: Diagnosis not present

## 2015-05-23 DIAGNOSIS — H43813 Vitreous degeneration, bilateral: Secondary | ICD-10-CM | POA: Diagnosis not present

## 2015-05-23 DIAGNOSIS — H353131 Nonexudative age-related macular degeneration, bilateral, early dry stage: Secondary | ICD-10-CM | POA: Diagnosis not present

## 2015-05-27 ENCOUNTER — Ambulatory Visit (INDEPENDENT_AMBULATORY_CARE_PROVIDER_SITE_OTHER): Payer: BLUE CROSS/BLUE SHIELD | Admitting: Family Medicine

## 2015-05-27 VITALS — BP 132/90 | HR 84 | Ht 72.0 in | Wt 229.2 lb

## 2015-05-27 DIAGNOSIS — M7582 Other shoulder lesions, left shoulder: Secondary | ICD-10-CM

## 2015-05-27 MED ORDER — TRIAMCINOLONE ACETONIDE 40 MG/ML IJ SUSP: 40.0000 mg | Freq: Once | INTRAMUSCULAR | Status: DC

## 2015-05-27 MED ORDER — LIDOCAINE HCL (PF) 1 % IJ SOLN: 2.0000 mL | Freq: Once | INTRAMUSCULAR | Status: DC

## 2015-05-27 NOTE — Progress Notes (Signed)
   Subjective:    Patient ID: Jeffrey Rodriguez, male    DOB: [birth year], 48 y.o.   MRN: [id number]  HPI  he is here for a recheck. He got roughly 6 weeks worth of benefit out of the first injection and then started having trouble again. He again notices difficulty with abduction and external rotation. He has not reinjured it.   Review of Systems     Objective:   Physical Exam  full motion of the shoulder. No palpable tenderness. supraspinatal testing was uncomfortable. Neer's and Hawkins test was also uncomfortable. No laxity noted.       Assessment & Plan:  Rotator cuff tendinitis, left - Plan: lidocaine (PF) (XYLOCAINE) 1 % injection 2 mL, triamcinolone acetonide (KENALOG-40) injection 40 mg  I discussed the fact that he only got 6 weeks with the benefit out of this which has me concerned about the actual diagnosis. We have decided to inject him again however if he gets minimal relief of his symptoms, further evaluation to orthopedics will be needed.

## 2015-06-22 ENCOUNTER — Other Ambulatory Visit: Payer: Self-pay | Admitting: Medical

## 2015-06-22 NOTE — Telephone Encounter (Signed)
Is this ok to refill?  

## 2015-07-20 ENCOUNTER — Other Ambulatory Visit: Payer: Self-pay | Admitting: Medical

## 2015-08-10 ENCOUNTER — Ambulatory Visit (INDEPENDENT_AMBULATORY_CARE_PROVIDER_SITE_OTHER): Payer: BLUE CROSS/BLUE SHIELD | Admitting: Medical

## 2015-08-10 ENCOUNTER — Ambulatory Visit
Admission: RE | Admit: 2015-08-10 | Discharge: 2015-08-10 | Disposition: A | Discharge status: Home / Self Care | Payer: BLUE CROSS/BLUE SHIELD | Source: Ambulatory Visit | Attending: Medical | Admitting: Medical

## 2015-08-10 ENCOUNTER — Encounter: Payer: Self-pay | Admitting: Medical

## 2015-08-10 VITALS — BP 118/80 | HR 77 | Wt 229.0 lb

## 2015-08-10 DIAGNOSIS — M2170 Unequal limb length (acquired), unspecified site: Secondary | ICD-10-CM

## 2015-08-10 DIAGNOSIS — M25512 Pain in left shoulder: Secondary | ICD-10-CM

## 2015-08-10 DIAGNOSIS — M7918 Myalgia, other site: Secondary | ICD-10-CM

## 2015-08-10 DIAGNOSIS — M791 Myalgia: Secondary | ICD-10-CM

## 2015-08-10 MED ORDER — IBUPROFEN 800 MG PO TABS: 800.0000 mg | ORAL_TABLET | Freq: Three times a day (TID) | ORAL | Status: DC

## 2015-08-10 MED ORDER — CYCLOBENZAPRINE HCL 10 MG PO TABS: 10.0000 mg | ORAL_TABLET | Freq: Every day | ORAL | Status: DC

## 2015-08-10 NOTE — Progress Notes (Signed)
Subjective: Chief Complaint  Patient presents with  . top of buttock pain    starts on the weekend, said he can feel it coming and then it will go away for a month or so. aching type of pain and makes it hard to walk.    Here for c/o pain in top of buttock mostly on the left, but has gotten on right prior.  Has been intermittent in the past.  Currently pain in upper left buttock x 3 days.   Using some Advil but its not helping.  No recent injury, trauma or fall. Denies any strenuous activity in the last week.  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  Not so much back pain, but mostly buttock area.  Denies pain down legs.   Feels slightly numb feeling in left leg in general, can't really pin point this, but not numb currently.  No abdominal pain, no problems with urination or bowels currently.  No other aggravating or relieving factors. No other complaint.   Past Medical History  Diagnosis Date  . Diabetes mellitus without complication ([place]) [id number]  . Hypertension 2010  . Wears glasses   . Microalbuminuria 11/2013  . GERD (gastroesophageal reflux disease)   . Obesity   . Smoker   . Mixed dyslipidemia    Past Surgical History  Procedure Laterality Date  . Colonoscopy      recommended 11/2013, he will check insurance and let me know   ROS as in subjective   Objective: BP 118/80 mmHg  Pulse 77  Wt 229 lb (103.874 kg)  SpO2 97%  Gen: wd, wn, nad Skin unremarkable, no rash Back: nontender, normal ROM, but lower back creases asymmetrical, higher crease on left low back than right MSK: mild tenderness left medial upper buttock, otherwise legs nontender, no swelling, no deformity,normal legs and joint ROM.  Legs with normal pulse strength and sensation, -SLR     Assessment: Encounter Diagnoses  Name Primary?  . Pain in left buttock Yes  . Leg length discrepancy   . Pain in joint, shoulder region, left    Plan: Go for xray. Discussed  differential including lumbosacral DDD, stenosis, MSK pain, piriformis syndrome, and leg length discrepancy causing impaired body mechanics.   Likely SI joint inflammation.  Advised NSAID as below, muscle relaxer QHS as below.   Discussed daily stretching routine.   F/u pending xray.

## 2015-08-25 ENCOUNTER — Other Ambulatory Visit: Payer: Self-pay | Admitting: Medical

## 2015-09-26 ENCOUNTER — Encounter (INDEPENDENT_AMBULATORY_CARE_PROVIDER_SITE_OTHER): Payer: BLUE CROSS/BLUE SHIELD | Admitting: Ophthalmology

## 2015-09-26 DIAGNOSIS — E113293 Type 2 diabetes mellitus with mild nonproliferative diabetic retinopathy without macular edema, bilateral: Secondary | ICD-10-CM

## 2015-09-26 DIAGNOSIS — I10 Essential (primary) hypertension: Secondary | ICD-10-CM

## 2015-09-26 DIAGNOSIS — E11319 Type 2 diabetes mellitus with unspecified diabetic retinopathy without macular edema: Secondary | ICD-10-CM | POA: Diagnosis not present

## 2015-09-26 DIAGNOSIS — H43813 Vitreous degeneration, bilateral: Secondary | ICD-10-CM | POA: Diagnosis not present

## 2015-09-26 DIAGNOSIS — H2513 Age-related nuclear cataract, bilateral: Secondary | ICD-10-CM | POA: Diagnosis not present

## 2015-09-26 DIAGNOSIS — H35033 Hypertensive retinopathy, bilateral: Secondary | ICD-10-CM

## 2015-09-26 DIAGNOSIS — H353131 Nonexudative age-related macular degeneration, bilateral, early dry stage: Secondary | ICD-10-CM | POA: Diagnosis not present

## 2015-09-26 DIAGNOSIS — H348332 Tributary (branch) retinal vein occlusion, bilateral, stable: Secondary | ICD-10-CM | POA: Diagnosis not present

## 2015-09-28 ENCOUNTER — Other Ambulatory Visit: Payer: Self-pay | Admitting: Medical

## 2015-09-30 ENCOUNTER — Telehealth: Payer: Self-pay | Admitting: Medical

## 2015-09-30 NOTE — Telephone Encounter (Signed)
appt already made.

## 2015-09-30 NOTE — Telephone Encounter (Signed)
Refill meds, get him in for med check

## 2015-09-30 NOTE — Telephone Encounter (Signed)
Pt called and needs a refill on his lisinopril and his glimeiride (amaryl), i made him a APPT for his diabetes check next WED, he did not know he needed to come back every 4 to 6 months for a diabetic check, he is out of his medicine pt uses      [facility] [place], [place] - [address]

## 2015-10-01 ENCOUNTER — Other Ambulatory Visit: Payer: Self-pay | Admitting: Family Medicine

## 2015-10-01 MED ORDER — LISINOPRIL 20 MG PO TABS: 20.0000 mg | ORAL_TABLET | Freq: Every day | ORAL | Status: DC

## 2015-10-01 MED ORDER — GLIMEPIRIDE 4 MG PO TABS: ORAL_TABLET | ORAL | Status: DC

## 2015-10-05 ENCOUNTER — Ambulatory Visit (INDEPENDENT_AMBULATORY_CARE_PROVIDER_SITE_OTHER): Payer: BLUE CROSS/BLUE SHIELD | Admitting: Medical

## 2015-10-05 ENCOUNTER — Encounter: Payer: Self-pay | Admitting: Medical

## 2015-10-05 VITALS — BP 150/108 | HR 79 | Wt 228.0 lb

## 2015-10-05 DIAGNOSIS — Z0000 Encounter for general adult medical examination without abnormal findings: Secondary | ICD-10-CM | POA: Insufficient documentation

## 2015-10-05 DIAGNOSIS — I10 Essential (primary) hypertension: Secondary | ICD-10-CM | POA: Insufficient documentation

## 2015-10-05 DIAGNOSIS — E118 Type 2 diabetes mellitus with unspecified complications: Secondary | ICD-10-CM

## 2015-10-05 DIAGNOSIS — F17200 Nicotine dependence, unspecified, uncomplicated: Secondary | ICD-10-CM

## 2015-10-05 DIAGNOSIS — E785 Hyperlipidemia, unspecified: Secondary | ICD-10-CM | POA: Insufficient documentation

## 2015-10-05 DIAGNOSIS — Z720 Tobacco use: Secondary | ICD-10-CM

## 2015-10-05 DIAGNOSIS — Z1211 Encounter for screening for malignant neoplasm of colon: Secondary | ICD-10-CM

## 2015-10-05 DIAGNOSIS — Z139 Encounter for screening, unspecified: Secondary | ICD-10-CM | POA: Insufficient documentation

## 2015-10-05 DIAGNOSIS — Z125 Encounter for screening for malignant neoplasm of prostate: Secondary | ICD-10-CM

## 2015-10-05 LAB — LIPID PANEL
CHOLESTEROL: 136 mg/dL (ref 125–200)
HDL: 29 mg/dL — ABNORMAL LOW (ref >=40)
LDL CALC: 44 mg/dL (ref <130)
TRIGLYCERIDES: 315 mg/dL — AB (ref <150)
Total CHOL/HDL Ratio: 4.7 Ratio (ref <=5.0)
VLDL: 63 mg/dL — AB (ref <30)

## 2015-10-05 LAB — COMPREHENSIVE METABOLIC PANEL
ALBUMIN: 4.2 g/dL (ref 3.6–5.1)
ALT: 19 U/L (ref 9–46)
AST: 16 U/L (ref 10–35)
Alkaline Phosphatase: 94 U/L (ref 40–115)
BILIRUBIN TOTAL: 0.4 mg/dL (ref 0.2–1.2)
BUN: 11 mg/dL (ref 7–25)
CHLORIDE: 104 mmol/L (ref 98–110)
CO2: 26 mmol/L (ref 20–31)
CREATININE: 0.84 mg/dL (ref 0.70–1.33)
Calcium: 9.1 mg/dL (ref 8.6–10.3)
Glucose, Bld: 178 mg/dL — ABNORMAL HIGH (ref 65–99)
Potassium: 3.8 mmol/L (ref 3.5–5.3)
SODIUM: 142 mmol/L (ref 135–146)
TOTAL PROTEIN: 7.2 g/dL (ref 6.1–8.1)

## 2015-10-05 LAB — CBC
HCT: 41.3 % (ref 39.0–52.0)
Hemoglobin: 14.6 g/dL (ref 13.0–17.0)
MCH: 28.8 pg (ref 26.0–34.0)
MCHC: 35.4 g/dL (ref 30.0–36.0)
MCV: 81.5 fL (ref 78.0–100.0)
MPV: 10.3 fL (ref 8.6–12.4)
PLATELETS: 211 10*3/uL (ref 150–400)
RBC: 5.07 MIL/uL (ref 4.22–5.81)
RDW: 14.5 % (ref 11.5–15.5)
WBC: 6.3 10*3/uL (ref 4.0–10.5)

## 2015-10-05 MED ORDER — ASPIRIN EC 81 MG PO TBEC: 81.0000 mg | DELAYED_RELEASE_TABLET | Freq: Every day | ORAL | Status: DC

## 2015-10-05 MED ORDER — ATORVASTATIN CALCIUM 20 MG PO TABS: 20.0000 mg | ORAL_TABLET | Freq: Every day | ORAL | Status: DC

## 2015-10-05 MED ORDER — LISINOPRIL-HYDROCHLOROTHIAZIDE 20-12.5 MG PO TABS: 1.0000 | ORAL_TABLET | Freq: Every day | ORAL | Status: DC

## 2015-10-05 NOTE — Progress Notes (Signed)
Subjective: Chief Complaint  Patient presents with  . diabetes check    morning sugars 118-135. said it is going good. has been off his meds a day or so he states. he didnt know he had to make appts every year he thought we would call and tell him   Here for routine med check.  Jeffrey Rodriguez has hx/o hypertension, diabetes type 2, hyperlipidemia, smoker.   From last diabetes check 07/2014 he states he never got a call about starting Lipitor although our documentation shows we did in fact instruct him to start Lipitor.  Thus, he has been noncompliant with this.  He is taking his BP medication and notes BPs run 130s/80s at home, checks sometimes.   He does check feet daily, sees eye doctor yearly, is compliant with metformin and glimepiride, sugars running good.  No particular c/o.  No other aggravating or relieving factors. No other complaint.  Past Medical History  Diagnosis Date  . Diabetes mellitus without complication ([place]) [id number]  . Hypertension 2010  . Wears glasses   . Microalbuminuria 11/2013  . GERD (gastroesophageal reflux disease)   . Obesity   . Smoker   . Mixed dyslipidemia    ROS as in subjective   Objective: BP 150/108 mmHg  Pulse 79  Wt 228 lb (103.42 kg)  Physical Exam  General appearance: alert, no distress, WD/WN Oral cavity: MMM, no lesions Neck: supple, no lymphadenopathy, no thyromegaly, no masses Heart: RRR, normal S1, S2, no murmurs Lungs: CTA bilaterally, no wheezes, rhonchi, or rales Abdomen: +bs, soft, non tender, non distended, no masses, no hepatomegaly, no splenomegaly Pulses: 2+ symmetric, upper and lower extremities, normal cap refill  Diabetic Foot Exam - Simple   Simple Foot Form  Diabetic Foot exam was performed with the following findings:  Yes 10/05/2015  9:06 AM  Visual Inspection  See comments:  Yes  Sensation Testing  Intact to touch and monofilament testing bilaterally:  Yes  Pulse Check  Posterior Tibialis and Dorsalis pulse intact  bilaterally:  Yes  Comments  Thickened yellowish nails, particular great toenails, Callous bilat medial distal great toe, dry volar feet, no other foot lesions          Adult ECG Report  Indication: baseline given history   Rate: 76 bpm   Rhythm: normal sinus rhythm  QRS Axis: 1 degrees  PR Interval: 126ms  QRS Duration: 152ms  QTc: 479ms  Conduction Disturbances: none  Other Abnormalities: none  Patient's cardiac risk factors are: diabetes mellitus, dyslipidemia, hypertension, male gender and smoking/ tobacco exposure.  EKG comparison: none  Narrative Interpretation: nonspecific T wave abnormality   Assessment: Encounter Diagnoses  Name Primary?  . Diabetes mellitus with complication ([place]) Yes  . Essential hypertension   . Hyperlipidemia   . Smoker   . Special screening for malignant neoplasms, colon      Plan: Diabetes type 2 - c/t daily foot checks, yearly eye exams, advised once yearly visits are not appropriate, should be coming in at least twice yearly for diabetes f/u.   C/t Metformin 1000mg  BID, Glimepiride 4mg  daily, c/t to monitor sugars, and labs today  HTN - increase to Lisinopril HCT 20/12.5mg  daily, c/t to monitor BP, EKG reviewed  Hyperlipidemia - begin ASA and Lipitor QHS  Smoker - advised smoking cessation. He is not ready to qiut.   We will refer you for your first screening colonoscopy for colon cancer screening.  follow up 9mo for physical, otherwise pending labs

## 2015-10-05 NOTE — Patient Instructions (Signed)
Encounter Diagnoses  Name Primary?  . Diabetes mellitus with complication ([place]) Yes  . Essential hypertension   . Hyperlipidemia   . Smoker   . Special screening for malignant neoplasms, colon     Recommendations:  See your eye doctor yearly for routine vision care and diabetes eye exam  We will refer you for your first colonoscopy  Begin Aspirin 81 mg daily for heart health.    Take this at bedtime daily along with cholesterol tablet Lipitor at bedtime  I changed your blood pressure pill to Lisinopril HCT today for better control  Continue all other medications as usual  STOP SMOKING!  Exercise daily  Eat a healthy low fat, low cholesterol diet  Check your feet daily for sores and wounds  You should be coming in at least twice yearly for diabetes follow up.    Your next visit should be a physical, fasting

## 2015-10-06 ENCOUNTER — Other Ambulatory Visit: Payer: Self-pay | Admitting: Medical

## 2015-10-06 LAB — HEMOGLOBIN A1C
HEMOGLOBIN A1C: 6.4 % — AB (ref <5.7)
MEAN PLASMA GLUCOSE: 137 mg/dL — AB (ref <117)

## 2015-10-06 LAB — MICROALBUMIN / CREATININE URINE RATIO
CREATININE, URINE: 163 mg/dL (ref 20–370)
Microalb Creat Ratio: 96 mcg/mg creat — ABNORMAL HIGH (ref <30)
Microalb, Ur: 15.7 mg/dL

## 2015-10-06 MED ORDER — GLIMEPIRIDE 4 MG PO TABS: ORAL_TABLET | ORAL | Status: DC

## 2015-10-06 MED ORDER — METFORMIN HCL 1000 MG PO TABS: ORAL_TABLET | ORAL | Status: DC

## 2015-10-14 ENCOUNTER — Encounter: Payer: Self-pay | Admitting: Internal Medicine

## 2015-10-19 ENCOUNTER — Encounter: Payer: Self-pay | Admitting: Gastroenterology

## 2015-11-07 ENCOUNTER — Other Ambulatory Visit: Payer: Self-pay | Admitting: Family Medicine

## 2015-12-15 HISTORY — PX: COLONOSCOPY: SHX174

## 2015-12-23 ENCOUNTER — Ambulatory Visit (AMBULATORY_SURGERY_CENTER): Payer: Self-pay | Admitting: *Deleted

## 2015-12-23 VITALS — Ht 72.0 in | Wt 223.4 lb

## 2015-12-23 DIAGNOSIS — Z1211 Encounter for screening for malignant neoplasm of colon: Secondary | ICD-10-CM

## 2015-12-23 NOTE — Progress Notes (Signed)
Patient denies any allergies to egg or soy products. Patient denies complications with anesthesia/sedation.  Patient denies oxygen use at home and denies diet medications. Emmi instructions for colonoscopy explained but patient denied.  Pamphlet given.  

## 2016-01-04 ENCOUNTER — Other Ambulatory Visit: Payer: Self-pay | Admitting: Medical

## 2016-01-04 ENCOUNTER — Other Ambulatory Visit: Payer: Self-pay | Admitting: Family Medicine

## 2016-01-04 MED ORDER — LISINOPRIL-HYDROCHLOROTHIAZIDE 20-12.5 MG PO TABS
1.0000 | ORAL_TABLET | Freq: Every day | ORAL | Status: DC
Start: 2016-01-04 — End: 2016-01-13

## 2016-01-04 NOTE — Telephone Encounter (Signed)
Not sure why he got refill request for lisinopril.   We did discontinue this, and his Lisinopril HCT was sent to pharmacy in march with year's worth of refills.  He needs to make sure pharmacy isn't still using the old script for plain lisinopril.

## 2016-01-04 NOTE — Telephone Encounter (Signed)
Last note states that pt was to increase Lisiopril/HCTZ, but on med list he has Lisinopril and Lisinopril/HCTZ. Please clarify. Electronic request attached for Lisinopril originally filled in 10/2015 with 1 RF.   Thanks, RLB

## 2016-01-06 ENCOUNTER — Ambulatory Visit (AMBULATORY_SURGERY_CENTER): Payer: BLUE CROSS/BLUE SHIELD | Admitting: Gastroenterology

## 2016-01-06 ENCOUNTER — Encounter: Payer: Self-pay | Admitting: Gastroenterology

## 2016-01-06 VITALS — BP 114/84 | HR 68 | Temp 97.5°F | Resp 18 | Ht 72.0 in | Wt 223.0 lb

## 2016-01-06 DIAGNOSIS — Z1211 Encounter for screening for malignant neoplasm of colon: Secondary | ICD-10-CM | POA: Diagnosis not present

## 2016-01-06 DIAGNOSIS — D125 Benign neoplasm of sigmoid colon: Secondary | ICD-10-CM | POA: Diagnosis not present

## 2016-01-06 DIAGNOSIS — D128 Benign neoplasm of rectum: Secondary | ICD-10-CM

## 2016-01-06 DIAGNOSIS — D129 Benign neoplasm of anus and anal canal: Secondary | ICD-10-CM

## 2016-01-06 MED ORDER — SODIUM CHLORIDE 0.9 % IV SOLN: 500.0000 mL | INTRAVENOUS | Status: DC

## 2016-01-06 NOTE — Progress Notes (Signed)
Report to PACU, RN, vss, BBS= Clear.  

## 2016-01-06 NOTE — Op Note (Signed)
[facility] Patient Name: Jeffrey Rodriguez Procedure Date: 01/06/2016 10:53 AM MRN: [id number] Endoscopist: Mallie Mussel L. Loletha Carrow , MD Age: 50 Referring MD:  Date of Birth: [birth year] Gender: Male Account #: [id number] Procedure:                Colonoscopy Indications:              Screening for colorectal malignant neoplasm, This                            is the patient's first colonoscopy Medicines:                Monitored Anesthesia Care Procedure:                Pre-Anesthesia Assessment:                           - Prior to the procedure, a History and Physical                            was performed, and patient medications and                            allergies were reviewed. The patient's tolerance of                            previous anesthesia was also reviewed. The risks                            and benefits of the procedure and the sedation                            options and risks were discussed with the patient.                            All questions were answered, and informed consent                            was obtained. Prior Anticoagulants: The patient has                            taken no previous anticoagulant or antiplatelet                            agents. ASA Grade Assessment: II - A patient with                            mild systemic disease. After reviewing the risks                            and benefits, the patient was deemed in                            satisfactory condition to undergo the procedure.  After obtaining informed consent, the colonoscope                            was passed under direct vision. Throughout the                            procedure, the patient's blood pressure, pulse, and                            oxygen saturations were monitored continuously. The                            Model CF-HQ190L [id number]) scope was introduced                            through the anus and  advanced to the the cecum,                            identified by appendiceal orifice and ileocecal                            valve. The quality of the bowel preparation was                            good. The ileocecal valve, appendiceal orifice, and                            rectum were photographed. Scope In: 11:01:13 AM Scope Out: 11:16:05 AM Scope Withdrawal Time: 0 hours 12 minutes 30 seconds  Total Procedure Duration: 0 hours 14 minutes 52 seconds  Findings:                 The perianal and digital rectal examinations were                            normal.                           Two sessile polyps were found in the rectum and mid                            sigmoid colon. The polyps were 2 mm in size. These                            polyps were removed with a cold biopsy forceps.                            Resection and retrieval were complete.                           The exam was otherwise without abnormality on                            direct and retroflexion views. Complications:  No immediate complications. Estimated Blood Loss:     Estimated blood loss: none. Impression:               - Two 2 mm polyps in the rectum and in the mid                            sigmoid colon, removed with a cold biopsy forceps.                            Resected and retrieved.                           - The examination was otherwise normal on direct                            and retroflexion views. Recommendation:           - Patient has a contact number available for                            emergencies. The signs and symptoms of potential                            delayed complications were discussed with the                            patient. Return to normal activities tomorrow.                            Written discharge instructions were provided to the                            patient.                           - Resume previous diet.                            - Continue present medications.                           - Await pathology results.                           - Repeat colonoscopy is recommended for                            surveillance. The colonoscopy date will be                            determined after pathology results from today's                            exam become available for review. Henry L. Loletha Carrow, MD 01/06/2016 11:20:06 AM This report has been signed electronically.

## 2016-01-06 NOTE — Patient Instructions (Signed)
YOU HAD AN ENDOSCOPIC PROCEDURE TODAY AT [facility]:   Refer to the procedure report that was given to you for any specific questions about what was found during the examination.  If the procedure report does not answer your questions, please call your gastroenterologist to clarify.  If you requested that your care partner not be given the details of your procedure findings, then the procedure report has been included in a sealed envelope for you to review at your convenience later.  YOU SHOULD EXPECT: Some feelings of bloating in the abdomen. Passage of more gas than usual.  Walking can help get rid of the air that was put into your GI tract during the procedure and reduce the bloating. If you had a lower endoscopy (such as a colonoscopy or flexible sigmoidoscopy) you may notice spotting of blood in your stool or on the toilet paper. If you underwent a bowel prep for your procedure, you may not have a normal bowel movement for a few days.  Please Note:  You might notice some irritation and congestion in your nose or some drainage.  This is from the oxygen used during your procedure.  There is no need for concern and it should clear up in a day or so.  SYMPTOMS TO REPORT IMMEDIATELY:   Following lower endoscopy (colonoscopy or flexible sigmoidoscopy):  Excessive amounts of blood in the stool  Significant tenderness or worsening of abdominal pains  Swelling of the abdomen that is new, acute  Fever of 100F or higher   For urgent or emergent issues, a gastroenterologist can be reached at any hour by calling [phone].   DIET: Your first meal following the procedure should be a small meal and then it is ok to progress to your normal diet. Heavy or fried foods are harder to digest and may make you feel nauseous or bloated.  Likewise, meals heavy in dairy and vegetables can increase bloating.  Drink plenty of fluids but you should avoid alcoholic beverages for 24  hours.  ACTIVITY:  You should plan to take it easy for the rest of today and you should NOT DRIVE or use heavy machinery until tomorrow (because of the sedation medicines used during the test).    FOLLOW UP: Our staff will call the number listed on your records the next business day following your procedure to check on you and address any questions or concerns that you may have regarding the information given to you following your procedure. If we do not reach you, we will leave a message.  However, if you are feeling well and you are not experiencing any problems, there is no need to return our call.  We will assume that you have returned to your regular daily activities without incident.  If any biopsies were taken you will be contacted by phone or by letter within the next 1-3 weeks.  Please call us at [phone] if you have not heard about the biopsies in 3 weeks.    SIGNATURES/CONFIDENTIALITY: You and/or your care partner have signed paperwork which will be entered into your electronic medical record.  These signatures attest to the fact that that the information above on your After Visit Summary has been reviewed and is understood.  Full responsibility of the confidentiality of this discharge information lies with you and/or your care-partner.  Read all of the handouts given to you by your recovery room nurse.  Thank-you for choosing [facility] for your healthcare needs today.

## 2016-01-06 NOTE — Progress Notes (Signed)
Called to room to assist during endoscopic procedure.  Patient ID and intended procedure confirmed with present staff. Received instructions for my participation in the procedure from the performing physician.  

## 2016-01-09 ENCOUNTER — Telehealth: Payer: Self-pay | Admitting: *Deleted

## 2016-01-09 NOTE — Telephone Encounter (Signed)
  Follow up Call-  Call back number 01/06/2016  Post procedure Call Back phone  # [phone]  Permission to leave phone message Yes     Patient questions:  Message left to call us if necessary.

## 2016-01-12 ENCOUNTER — Ambulatory Visit (INDEPENDENT_AMBULATORY_CARE_PROVIDER_SITE_OTHER): Payer: BLUE CROSS/BLUE SHIELD | Admitting: Medical

## 2016-01-12 ENCOUNTER — Encounter: Payer: Self-pay | Admitting: Gastroenterology

## 2016-01-12 ENCOUNTER — Encounter: Payer: Self-pay | Admitting: Medical

## 2016-01-12 VITALS — BP 112/80 | HR 85 | Wt 218.0 lb

## 2016-01-12 DIAGNOSIS — E118 Type 2 diabetes mellitus with unspecified complications: Secondary | ICD-10-CM

## 2016-01-12 DIAGNOSIS — Z125 Encounter for screening for malignant neoplasm of prostate: Secondary | ICD-10-CM

## 2016-01-12 DIAGNOSIS — E786 Lipoprotein deficiency: Secondary | ICD-10-CM

## 2016-01-12 DIAGNOSIS — E785 Hyperlipidemia, unspecified: Secondary | ICD-10-CM | POA: Diagnosis not present

## 2016-01-12 DIAGNOSIS — Z720 Tobacco use: Secondary | ICD-10-CM

## 2016-01-12 DIAGNOSIS — R809 Proteinuria, unspecified: Secondary | ICD-10-CM | POA: Diagnosis not present

## 2016-01-12 DIAGNOSIS — I10 Essential (primary) hypertension: Secondary | ICD-10-CM

## 2016-01-12 DIAGNOSIS — Z23 Encounter for immunization: Secondary | ICD-10-CM

## 2016-01-12 DIAGNOSIS — F17200 Nicotine dependence, unspecified, uncomplicated: Secondary | ICD-10-CM

## 2016-01-12 LAB — LIPID PANEL
CHOL/HDL RATIO: 3.6 ratio (ref <=5.0)
CHOLESTEROL: 111 mg/dL — AB (ref 125–200)
HDL: 31 mg/dL — ABNORMAL LOW (ref >=40)
LDL Cholesterol: 36 mg/dL (ref <130)
Triglycerides: 222 mg/dL — ABNORMAL HIGH (ref <150)
VLDL: 44 mg/dL — ABNORMAL HIGH (ref <30)

## 2016-01-12 LAB — COMPREHENSIVE METABOLIC PANEL
ALK PHOS: 93 U/L (ref 40–115)
ALT: 26 U/L (ref 9–46)
AST: 22 U/L (ref 10–35)
Albumin: 4.3 g/dL (ref 3.6–5.1)
BILIRUBIN TOTAL: 0.4 mg/dL (ref 0.2–1.2)
BUN: 22 mg/dL (ref 7–25)
CO2: 25 mmol/L (ref 20–31)
CREATININE: 1.27 mg/dL (ref 0.70–1.33)
Calcium: 9.8 mg/dL (ref 8.6–10.3)
Chloride: 100 mmol/L (ref 98–110)
GLUCOSE: 154 mg/dL — AB (ref 65–99)
Potassium: 3.9 mmol/L (ref 3.5–5.3)
SODIUM: 136 mmol/L (ref 135–146)
TOTAL PROTEIN: 7.7 g/dL (ref 6.1–8.1)

## 2016-01-12 NOTE — Progress Notes (Signed)
Subjective: Chief Complaint  Patient presents with  . Diabetes    states everything is going good and morning sugars range from 125-130. checks feet. no problems or concerns   Here for routine med check.  Last visit for same was March.  At that visit we added Lisinopril HCT, Pravachol and Aspirin.    There had been some noncompliance issues prior to that visit.   He reports taking the medications listed today without side effects.   Since last visit had his colonoscopy, was advised repeat in 5 years.  He is exercising some, reports eating relatively healthy.  He is checking feet.  He has no new c/o. No other aggravating or relieving factors. No other complaint.  Past Medical History  Diagnosis Date  . Diabetes mellitus without complication ([place]) [id number]  . Hypertension 2010  . Wears glasses   . Microalbuminuria 11/2013  . GERD (gastroesophageal reflux disease)   . Obesity   . Smoker     .5 ppd  . Mixed dyslipidemia     diet and exercise   ROS as in subjective   Objective: BP 112/80 mmHg  Pulse 85  Wt 218 lb (98.884 kg)  Physical Exam  General appearance: alert, no distress, WD/WN Oral cavity: MMM, no lesions Neck: supple, no lymphadenopathy, no thyromegaly, no masses Heart: RRR, normal S1, S2, no murmurs Lungs: CTA bilaterally, no wheezes, rhonchi, or rales Pulses: 2+ symmetric, upper and lower extremities, normal cap refill Ext: no edema DRE: declined today   Assessment: Encounter Diagnoses  Name Primary?  . Diabetes mellitus with complication ([place]) Yes  . Essential hypertension   . Hyperlipidemia   . Smoker   . Microalbuminuria   . Screening for prostate cancer   . Low HDL (under 40)   . Need for Tdap vaccination      Plan: Diabetes type 2 - c/t daily foot checks, yearly eye exams,labs today.  C/t Metformin 1000mg  BID, Glimepiride 4mg  daily, c/t to monitor sugars  HTN -c/t Lisinopril HCT 20/12.5mg  daily, c/t to monitor BP  Hyperlipidemia - c/t ASA and Lipitor  QHS, labs today  Smoker - advised smoking cessation. He is not ready to quit.   reviewed his colonoscopy and pathology notes from last week  Counseled on the Tdap (tetanus, diptheria, and acellular pertussis) vaccine.  Vaccine information sheet given. Tdap vaccine given after consent obtained.  PSA screen today, declines rectal exam today  follow up pending labs

## 2016-01-13 ENCOUNTER — Other Ambulatory Visit: Payer: Self-pay | Admitting: Medical

## 2016-01-13 LAB — HEMOGLOBIN A1C
HEMOGLOBIN A1C: 8.9 % — AB (ref <5.7)
MEAN PLASMA GLUCOSE: 209 mg/dL

## 2016-01-13 LAB — PSA: PSA: 1.04 ng/mL (ref <=4.00)

## 2016-01-13 MED ORDER — SITAGLIPTIN PHOS-METFORMIN HCL 50-1000 MG PO TABS: 1.0000 | ORAL_TABLET | Freq: Two times a day (BID) | ORAL | Status: DC

## 2016-01-13 MED ORDER — LISINOPRIL-HYDROCHLOROTHIAZIDE 20-12.5 MG PO TABS: 1.0000 | ORAL_TABLET | Freq: Every day | ORAL | Status: DC

## 2016-01-13 MED ORDER — NIACIN 250 MG PO TABS: 250.0000 mg | ORAL_TABLET | Freq: Every day | ORAL | Status: DC

## 2016-01-13 MED ORDER — OMEPRAZOLE 20 MG PO CPDR: 20.0000 mg | DELAYED_RELEASE_CAPSULE | Freq: Every day | ORAL | Status: DC | PRN

## 2016-01-13 MED ORDER — GLIMEPIRIDE 4 MG PO TABS
ORAL_TABLET | ORAL | Status: DC
Start: 2016-01-13 — End: 2017-02-20

## 2016-01-13 MED ORDER — ATORVASTATIN CALCIUM 10 MG PO TABS: 10.0000 mg | ORAL_TABLET | Freq: Every day | ORAL | Status: DC

## 2016-01-13 MED ORDER — EMPAGLIFLOZIN-METFORMIN HCL 5-1000 MG PO TABS: 1.0000 | ORAL_TABLET | Freq: Two times a day (BID) | ORAL | Status: DC

## 2016-01-16 ENCOUNTER — Telehealth: Payer: Self-pay | Admitting: Medical

## 2016-01-16 NOTE — Telephone Encounter (Signed)
janumet too expensive, needs different RX

## 2016-01-17 NOTE — Telephone Encounter (Signed)
Since this is the second "too expensive" or not covered medications, have him call insurer to find out what Metformin Combination drugs are covered or affordable.

## 2016-01-18 NOTE — Telephone Encounter (Signed)
Pt is aware and will call insurance

## 2016-01-18 NOTE — Telephone Encounter (Signed)
See below

## 2016-03-05 ENCOUNTER — Encounter (INDEPENDENT_AMBULATORY_CARE_PROVIDER_SITE_OTHER): Payer: BLUE CROSS/BLUE SHIELD | Admitting: Ophthalmology

## 2016-03-05 DIAGNOSIS — E11319 Type 2 diabetes mellitus with unspecified diabetic retinopathy without macular edema: Secondary | ICD-10-CM

## 2016-03-05 DIAGNOSIS — E113291 Type 2 diabetes mellitus with mild nonproliferative diabetic retinopathy without macular edema, right eye: Secondary | ICD-10-CM

## 2016-03-05 DIAGNOSIS — H43813 Vitreous degeneration, bilateral: Secondary | ICD-10-CM

## 2016-03-05 DIAGNOSIS — H2513 Age-related nuclear cataract, bilateral: Secondary | ICD-10-CM | POA: Diagnosis not present

## 2016-03-05 DIAGNOSIS — H353131 Nonexudative age-related macular degeneration, bilateral, early dry stage: Secondary | ICD-10-CM | POA: Diagnosis not present

## 2016-03-05 DIAGNOSIS — H35033 Hypertensive retinopathy, bilateral: Secondary | ICD-10-CM | POA: Diagnosis not present

## 2016-03-05 DIAGNOSIS — I10 Essential (primary) hypertension: Secondary | ICD-10-CM | POA: Diagnosis not present

## 2016-03-05 DIAGNOSIS — H348312 Tributary (branch) retinal vein occlusion, right eye, stable: Secondary | ICD-10-CM | POA: Diagnosis not present

## 2016-03-05 DIAGNOSIS — E113392 Type 2 diabetes mellitus with moderate nonproliferative diabetic retinopathy without macular edema, left eye: Secondary | ICD-10-CM

## 2016-03-22 ENCOUNTER — Telehealth: Payer: Self-pay | Admitting: Medical

## 2016-03-22 NOTE — Telephone Encounter (Signed)
Called pt to set up diabetes check pt has form in red folder per shane

## 2016-04-12 ENCOUNTER — Encounter: Payer: Self-pay | Admitting: Medical

## 2016-04-12 ENCOUNTER — Ambulatory Visit (INDEPENDENT_AMBULATORY_CARE_PROVIDER_SITE_OTHER): Payer: BLUE CROSS/BLUE SHIELD | Admitting: Medical

## 2016-04-12 VITALS — BP 122/84 | HR 72 | Temp 97.8°F | Ht 72.0 in | Wt 221.0 lb

## 2016-04-12 DIAGNOSIS — R809 Proteinuria, unspecified: Secondary | ICD-10-CM | POA: Diagnosis not present

## 2016-04-12 DIAGNOSIS — F17200 Nicotine dependence, unspecified, uncomplicated: Secondary | ICD-10-CM

## 2016-04-12 DIAGNOSIS — I10 Essential (primary) hypertension: Secondary | ICD-10-CM

## 2016-04-12 DIAGNOSIS — Z23 Encounter for immunization: Secondary | ICD-10-CM | POA: Diagnosis not present

## 2016-04-12 DIAGNOSIS — Z720 Tobacco use: Secondary | ICD-10-CM | POA: Diagnosis not present

## 2016-04-12 DIAGNOSIS — E785 Hyperlipidemia, unspecified: Secondary | ICD-10-CM | POA: Diagnosis not present

## 2016-04-12 DIAGNOSIS — E118 Type 2 diabetes mellitus with unspecified complications: Secondary | ICD-10-CM | POA: Diagnosis not present

## 2016-04-12 DIAGNOSIS — E786 Lipoprotein deficiency: Secondary | ICD-10-CM

## 2016-04-12 LAB — POCT GLYCOSYLATED HEMOGLOBIN (HGB A1C): Hemoglobin A1C: 8

## 2016-04-12 MED ORDER — DAPAGLIFLOZIN PRO-METFORMIN ER 5-1000 MG PO TB24: 1.0000 | ORAL_TABLET | Freq: Every day | ORAL | 3 refills | Status: DC

## 2016-04-12 NOTE — Progress Notes (Signed)
Subjective: Chief Complaint  Patient presents with  . Diabetes    taking medicines as directed, CBG numbers have been compliant   Here for routine med check.  He has hx/o diabetes, GERD, hypertension, obesity, smoking, microalbuminuria, dyslipidemia  Diabetes - last visit we advised he stop plain metformin but change to Synjardy BID.  compliant with Metformin + Glimepiride 4mg  daily since Synjardy was not covered.   Fasting glucose running in the 110-125 range.  Has cut out soda, sweet tea, trying to eat healthier.    HTN - compliant with Lisinopril HCT 20/12.5mg  daily  hyperlipidemia - compliant with Lipitor 10mg  daily lowered from prior visit, not taking niacin 250mg  daily although this was advised last visit.  Compliant with aspirin 81mg  daily  GERD - taking Prilosec 20mg  daily  He has no new c/o. No other aggravating or relieving factors. No other complaint.  Past Medical History:  Diagnosis Date  . Diabetes mellitus without complication ([place]) [id number]  . GERD (gastroesophageal reflux disease)   . Hypertension 2010  . Microalbuminuria 11/2013  . Mixed dyslipidemia    diet and exercise  . Obesity   . Smoker    .5 ppd  . Wears glasses    Current Outpatient Prescriptions on File Prior to Visit  Medication Sig Dispense Refill  . aspirin EC 81 MG tablet Take 1 tablet (81 mg total) by mouth daily. 90 tablet 3  . atorvastatin (LIPITOR) 10 MG tablet Take 1 tablet (10 mg total) by mouth daily. 90 tablet 3  . glimepiride (AMARYL) 4 MG tablet TAKE ONE TABLET BY MOUTH ONCE DAILY WITH BREAKFAST 90 tablet 3  . ibuprofen (ADVIL,MOTRIN) 800 MG tablet Take 1 tablet (800 mg total) by mouth 3 (three) times daily. Take with food. 30 tablet 0  . lisinopril-hydrochlorothiazide (ZESTORETIC) 20-12.5 MG tablet Take 1 tablet by mouth daily. 90 tablet 3  . omeprazole (PRILOSEC) 20 MG capsule Take 1 capsule (20 mg total) by mouth daily as needed. Reported on 01/12/2016 90 capsule 3  . niacin 250 MG tablet  Take 1 tablet (250 mg total) by mouth at bedtime. (Patient not taking: Reported on 04/12/2016) 30 tablet 5   Current Facility-Administered Medications on File Prior to Visit  Medication Dose Route Frequency Provider Last Rate Last Dose  . triamcinolone acetonide (KENALOG-40) injection 40 mg  40 mg Intramuscular Once Denita Lung, MD       ROS as in subjective   Objective: BP 122/84 (BP Location: Right Arm, Patient Position: Sitting, Cuff Size: Large)   Pulse 72   Temp 97.8 F (36.6 C) (Oral)   Ht 6' (1.829 m)   Wt 221 lb (100.2 kg)   SpO2 97%   BMI 29.97 kg/m   Wt Readings from Last 3 Encounters:  04/12/16 221 lb (100.2 kg)  01/12/16 218 lb (98.9 kg)  01/06/16 223 lb (101.2 kg)   Physical Exam  General appearance: alert, no distress, WD/WN Oral cavity: MMM, no lesions Neck: supple, no lymphadenopathy, no thyromegaly, no masses. No bruits. Heart: RRR, normal S1, S2, no murmurs Lungs: CTA bilaterally, no wheezes, rhonchi, or rales Pulses: 2+ symmetric, upper and lower extremities, normal cap refill Ext: no edema   Assessment: Encounter Diagnoses  Name Primary?  . Diabetes mellitus with complication ([place]) Yes  . Essential hypertension   . Smoker   . Need for prophylactic vaccination and inoculation against influenza   . Hyperlipidemia   . Microalbuminuria   . Low HDL (under 40)  Plan: Diabetes type 2 - c/t daily foot checks, yearly eye exams.  Hgb1AC today 8.0 %.  c/t Glimepiride 4mg  daily, stop plain metformin and begin Xigduo today. C/t to monitor sugars  HTN -c/t Lisinopril HCT 20/12.5mg  daily, c/t to monitor BP  Hyperlipidemia - c/t ASA and cut Lipitor down to 5mg  daily. reviewed recent biometric screen at work from 03/30/16 showing LDL 37, TRIG 122, HDL 39, total < 100.    Smoker - advised smoking cessation. He is not ready to quit.   Counseled on the influenza virus vaccine.  Vaccine information sheet given.  Influenza vaccine given after consent  obtained.  follow up 104mo.  Patient Instructions   Encounter Diagnoses  Name Primary?  . Diabetes mellitus with complication ([place]) Yes  . Essential hypertension   . Smoker   . Need for prophylactic vaccination and inoculation against influenza   . Hyperlipidemia   . Microalbuminuria   . Low HDL (under 40)    Recommendations:  Begin Xigduo daily for diabetes.  This contains Metformin, so you wold stop the plain Metformin  Continue Glimeperide for diabetes  Cut the Atorvastatin in half, and just use 1/2 tablet or 5mg  daily  Continue efforts at weight loss, healthy diet  Continue aspirin daily  continue Lisinopril HCT daily  Recheck fasting 3 months

## 2016-04-12 NOTE — Patient Instructions (Signed)
Encounter Diagnoses  Name Primary?  . Diabetes mellitus with complication ([place]) Yes  . Essential hypertension   . Smoker   . Need for prophylactic vaccination and inoculation against influenza   . Hyperlipidemia   . Microalbuminuria   . Low HDL (under 40)    Recommendations:  Begin Xigduo daily for diabetes.  This contains Metformin, so you wold stop the plain Metformin  Continue Glimeperide for diabetes  Cut the Atorvastatin in half, and just use 1/2 tablet or 5mg  daily  Continue efforts at weight loss, healthy diet  Continue aspirin daily  continue Lisinopril HCT daily  Recheck fasting 3 months

## 2016-07-19 ENCOUNTER — Ambulatory Visit: Payer: BLUE CROSS/BLUE SHIELD | Admitting: Medical

## 2016-07-26 ENCOUNTER — Encounter: Payer: Self-pay | Admitting: Medical

## 2016-07-26 ENCOUNTER — Ambulatory Visit (INDEPENDENT_AMBULATORY_CARE_PROVIDER_SITE_OTHER): Payer: BLUE CROSS/BLUE SHIELD | Admitting: Medical

## 2016-07-26 VITALS — BP 120/82 | HR 82 | Wt 223.8 lb

## 2016-07-26 DIAGNOSIS — E118 Type 2 diabetes mellitus with unspecified complications: Secondary | ICD-10-CM | POA: Diagnosis not present

## 2016-07-26 DIAGNOSIS — F17200 Nicotine dependence, unspecified, uncomplicated: Secondary | ICD-10-CM

## 2016-07-26 DIAGNOSIS — E786 Lipoprotein deficiency: Secondary | ICD-10-CM | POA: Diagnosis not present

## 2016-07-26 DIAGNOSIS — I10 Essential (primary) hypertension: Secondary | ICD-10-CM

## 2016-07-26 DIAGNOSIS — R809 Proteinuria, unspecified: Secondary | ICD-10-CM | POA: Diagnosis not present

## 2016-07-26 DIAGNOSIS — B353 Tinea pedis: Secondary | ICD-10-CM

## 2016-07-26 DIAGNOSIS — B351 Tinea unguium: Secondary | ICD-10-CM | POA: Diagnosis not present

## 2016-07-26 DIAGNOSIS — E785 Hyperlipidemia, unspecified: Secondary | ICD-10-CM

## 2016-07-26 LAB — COMPREHENSIVE METABOLIC PANEL
ALK PHOS: 69 U/L (ref 40–115)
ALT: 23 U/L (ref 9–46)
AST: 23 U/L (ref 10–35)
Albumin: 4.2 g/dL (ref 3.6–5.1)
BILIRUBIN TOTAL: 0.3 mg/dL (ref 0.2–1.2)
BUN: 14 mg/dL (ref 7–25)
CO2: 24 mmol/L (ref 20–31)
Calcium: 9.5 mg/dL (ref 8.6–10.3)
Chloride: 106 mmol/L (ref 98–110)
Creat: 1.17 mg/dL (ref 0.70–1.33)
GLUCOSE: 104 mg/dL — AB (ref 65–99)
POTASSIUM: 3.8 mmol/L (ref 3.5–5.3)
Sodium: 139 mmol/L (ref 135–146)
Total Protein: 7.6 g/dL (ref 6.1–8.1)

## 2016-07-26 MED ORDER — TERBINAFINE HCL 250 MG PO TABS: 250.0000 mg | ORAL_TABLET | Freq: Every day | ORAL | 1 refills | Status: DC

## 2016-07-26 NOTE — Progress Notes (Signed)
Subjective: Chief Complaint  Patient presents with  . dm check    dm check no other concerns    Here for routine med check.  He has hx/o diabetes, GERD, hypertension, obesity, smoking, microalbuminuria, dyslipidemia  Diabetes - last visit we advised he stop plain metformin but change to Xigduo.   He is compliant with out side effects. Compliant with Glimepiride 4mg  daily.   Fasting glucose running in the 110-125 range.  Has cut out soda, sweet tea, trying to eat healthier.   Recent HgbA1C in November at work was 7.9%.   HTN - compliant with Lisinopril HCT 20/12.5mg  daily  hyperlipidemia - compliant with Lipitor 10mg  daily lowered from prior visit, compliant with ASA daily.  GERD - taking Prilosec 20mg  daily  Exercising - walking, working, getting membership to [employer]  Diet - good.  Eye doctor next month.    Still smoking.  He has no new c/o. No other aggravating or relieving factors. No other complaint.  Past Medical History:  Diagnosis Date  . Diabetes mellitus without complication ([place]) [id number]  . GERD (gastroesophageal reflux disease)   . Hypertension 2010  . Microalbuminuria 11/2013  . Mixed dyslipidemia    diet and exercise  . Obesity   . Smoker    .5 ppd  . Wears glasses    Current Outpatient Prescriptions on File Prior to Visit  Medication Sig Dispense Refill  . aspirin EC 81 MG tablet Take 1 tablet (81 mg total) by mouth daily. 90 tablet 3  . atorvastatin (LIPITOR) 10 MG tablet Take 1 tablet (10 mg total) by mouth daily. 90 tablet 3  . Dapagliflozin-Metformin HCl ER (XIGDUO XR) 11-998 MG TB24 Take 1 tablet by mouth daily. 30 tablet 3  . glimepiride (AMARYL) 4 MG tablet TAKE ONE TABLET BY MOUTH ONCE DAILY WITH BREAKFAST 90 tablet 3  . ibuprofen (ADVIL,MOTRIN) 800 MG tablet Take 1 tablet (800 mg total) by mouth 3 (three) times daily. Take with food. 30 tablet 0  . lisinopril-hydrochlorothiazide (ZESTORETIC) 20-12.5 MG tablet Take 1 tablet by mouth daily. 90 tablet 3  .  omeprazole (PRILOSEC) 20 MG capsule Take 1 capsule (20 mg total) by mouth daily as needed. Reported on 01/12/2016 90 capsule 3   Current Facility-Administered Medications on File Prior to Visit  Medication Dose Route Frequency Provider Last Rate Last Dose  . triamcinolone acetonide (KENALOG-40) injection 40 mg  40 mg Intramuscular Once Denita Lung, MD       ROS as in subjective   Objective: BP 120/82   Pulse 82   Wt 223 lb 12.8 oz (101.5 kg)   SpO2 97%   BMI 30.35 kg/m   Wt Readings from Last 3 Encounters:  07/26/16 223 lb 12.8 oz (101.5 kg)  04/12/16 221 lb (100.2 kg)  01/12/16 218 lb (98.9 kg)   BP Readings from Last 3 Encounters:  07/26/16 120/82  04/12/16 122/84  01/12/16 112/80   Physical Exam  General appearance: alert, no distress, WD/WN Oral cavity: MMM, no lesions Neck: supple, no lymphadenopathy, no thyromegaly, no masses. No bruits. Heart: RRR, normal S1, S2, no murmurs Lungs: CTA bilaterally, no wheezes, rhonchi, or rales Pulses: 2+ symmetric, upper and lower extremities, normal cap refill Ext: no edema  Diabetic Foot Exam - Simple   Simple Foot Form Diabetic Foot exam was performed with the following findings:  Yes 07/26/2016 10:17 AM  Visual Inspection See comments:  Yes Sensation Testing Intact to touch and monofilament testing bilaterally:  Yes Pulse Check  Posterior Tibialis and Dorsalis pulse intact bilaterally:  Yes Comments bilat great toenails thickened yellow brown color, cracking and odor of bilat soles, some maceration between small toes c/w tinea pedis      Assessment: Encounter Diagnoses  Name Primary?  . Diabetes mellitus with complication ([place]) Yes  . Essential hypertension   . Hyperlipidemia, unspecified hyperlipidemia type   . Low HDL (under 40)   . Microalbuminuria   . Smoker   . Onychomycosis   . Tinea pedis of both feet      Plan: Diabetes type 2 - c/t daily foot checks, yearly eye exams.   c/t Glimepiride 4mg  daily,  Xigduo. C/t to monitor sugars  HTN -c/t Lisinopril HCT 20/12.5mg  daily, c/t to monitor BP  Hyperlipidemia - c/t ASA and c/t Lipitor 10mg  daily. reviewed recent biometric screen at work from 03/30/16 showing LDL 37, TRIG 122, HDL 39, total < 100.    Smoker - advised smoking cessation. He is not ready to quit.  Spent 73min on counseling.  PFT reviewed which was normal.  Discussed tinea pedis, onychomycosis and risk with diabetes.  Begin oral Lamisil.  Gave him sterile urine cup to return nail if insurance requires proof of fungus, although clinically its obvious.  follow up 52mo.  Jeffrey Rodriguez was seen today for dm check.  Diagnoses and all orders for this visit:  Diabetes mellitus with complication ([place]) -     Comprehensive metabolic panel -     Hemoglobin A1c -     HM DIABETES FOOT EXAM -     HM DIABETES EYE EXAM  Essential hypertension -     Comprehensive metabolic panel -     Hemoglobin A1c  Hyperlipidemia, unspecified hyperlipidemia type  Low HDL (under 40)  Microalbuminuria  Smoker -     Spirometry with Graph  Onychomycosis -     terbinafine (LAMISIL) 250 MG tablet; Take 1 tablet (250 mg total) by mouth daily.  Tinea pedis of both feet -     terbinafine (LAMISIL) 250 MG tablet; Take 1 tablet (250 mg total) by mouth daily.

## 2016-07-27 ENCOUNTER — Other Ambulatory Visit: Payer: Self-pay | Admitting: Medical

## 2016-07-27 LAB — HEMOGLOBIN A1C
HEMOGLOBIN A1C: 6.6 % — AB (ref <5.7)
Mean Plasma Glucose: 143 mg/dL

## 2016-07-27 MED ORDER — DAPAGLIFLOZIN PRO-METFORMIN ER 5-1000 MG PO TB24: 1.0000 | ORAL_TABLET | Freq: Every day | ORAL | 3 refills | Status: DC

## 2016-09-05 ENCOUNTER — Ambulatory Visit (INDEPENDENT_AMBULATORY_CARE_PROVIDER_SITE_OTHER): Payer: BLUE CROSS/BLUE SHIELD | Admitting: Ophthalmology

## 2016-09-05 DIAGNOSIS — H43813 Vitreous degeneration, bilateral: Secondary | ICD-10-CM

## 2016-09-05 DIAGNOSIS — E11319 Type 2 diabetes mellitus with unspecified diabetic retinopathy without macular edema: Secondary | ICD-10-CM | POA: Diagnosis not present

## 2016-09-05 DIAGNOSIS — H353131 Nonexudative age-related macular degeneration, bilateral, early dry stage: Secondary | ICD-10-CM

## 2016-09-05 DIAGNOSIS — H35033 Hypertensive retinopathy, bilateral: Secondary | ICD-10-CM

## 2016-09-05 DIAGNOSIS — I10 Essential (primary) hypertension: Secondary | ICD-10-CM | POA: Diagnosis not present

## 2016-09-05 DIAGNOSIS — E113293 Type 2 diabetes mellitus with mild nonproliferative diabetic retinopathy without macular edema, bilateral: Secondary | ICD-10-CM | POA: Diagnosis not present

## 2016-09-05 DIAGNOSIS — H348312 Tributary (branch) retinal vein occlusion, right eye, stable: Secondary | ICD-10-CM | POA: Diagnosis not present

## 2016-09-13 ENCOUNTER — Other Ambulatory Visit: Payer: Self-pay | Admitting: Medical

## 2017-01-26 ENCOUNTER — Other Ambulatory Visit: Payer: Self-pay | Admitting: Medical

## 2017-01-28 NOTE — Telephone Encounter (Signed)
Patient scheduled CPE or 02/19/17 but needs refill on lisinopril now

## 2017-01-28 NOTE — Telephone Encounter (Signed)
Called and l/m for pt call us back, to make appt.

## 2017-02-19 ENCOUNTER — Encounter: Payer: Self-pay | Admitting: Medical

## 2017-02-19 ENCOUNTER — Ambulatory Visit (INDEPENDENT_AMBULATORY_CARE_PROVIDER_SITE_OTHER): Payer: BLUE CROSS/BLUE SHIELD | Admitting: Medical

## 2017-02-19 VITALS — BP 112/80 | HR 80 | Ht 70.0 in | Wt 217.2 lb

## 2017-02-19 DIAGNOSIS — F17200 Nicotine dependence, unspecified, uncomplicated: Secondary | ICD-10-CM

## 2017-02-19 DIAGNOSIS — I10 Essential (primary) hypertension: Secondary | ICD-10-CM

## 2017-02-19 DIAGNOSIS — Z7185 Encounter for immunization safety counseling: Secondary | ICD-10-CM

## 2017-02-19 DIAGNOSIS — E782 Mixed hyperlipidemia: Secondary | ICD-10-CM | POA: Diagnosis not present

## 2017-02-19 DIAGNOSIS — Z0000 Encounter for general adult medical examination without abnormal findings: Secondary | ICD-10-CM

## 2017-02-19 DIAGNOSIS — E118 Type 2 diabetes mellitus with unspecified complications: Secondary | ICD-10-CM

## 2017-02-19 DIAGNOSIS — R809 Proteinuria, unspecified: Secondary | ICD-10-CM

## 2017-02-19 DIAGNOSIS — Z1159 Encounter for screening for other viral diseases: Secondary | ICD-10-CM

## 2017-02-19 DIAGNOSIS — Z7189 Other specified counseling: Secondary | ICD-10-CM

## 2017-02-19 LAB — CBC
HEMATOCRIT: 42 % (ref 38.5–50.0)
Hemoglobin: 14.5 g/dL (ref 13.2–17.1)
MCH: 29.1 pg (ref 27.0–33.0)
MCHC: 34.5 g/dL (ref 32.0–36.0)
MCV: 84.2 fL (ref 80.0–100.0)
MPV: 10.2 fL (ref 7.5–12.5)
PLATELETS: 214 10*3/uL (ref 140–400)
RBC: 4.99 MIL/uL (ref 4.20–5.80)
RDW: 14.9 % (ref 11.0–15.0)
WBC: 6.3 10*3/uL (ref 4.0–10.5)

## 2017-02-19 LAB — COMPREHENSIVE METABOLIC PANEL
ALT: 25 U/L (ref 9–46)
AST: 19 U/L (ref 10–35)
Albumin: 4.1 g/dL (ref 3.6–5.1)
Alkaline Phosphatase: 91 U/L (ref 40–115)
BUN: 12 mg/dL (ref 7–25)
CALCIUM: 9.1 mg/dL (ref 8.6–10.3)
CO2: 29 mmol/L (ref 20–32)
Chloride: 100 mmol/L (ref 98–110)
Creat: 1 mg/dL (ref 0.70–1.33)
GLUCOSE: 236 mg/dL — AB (ref 65–99)
Potassium: 3.7 mmol/L (ref 3.5–5.3)
Sodium: 136 mmol/L (ref 135–146)
Total Bilirubin: 0.4 mg/dL (ref 0.2–1.2)
Total Protein: 7.5 g/dL (ref 6.1–8.1)

## 2017-02-19 LAB — POCT URINALYSIS DIP (PROADVANTAGE DEVICE)
Bilirubin, UA: NEGATIVE
Ketones, POC UA: NEGATIVE mg/dL
Leukocytes, UA: NEGATIVE
Nitrite, UA: NEGATIVE
RBC UA: NEGATIVE
SPECIFIC GRAVITY, URINE: 1.025
Urobilinogen, Ur: NEGATIVE
pH, UA: 6 (ref 5.0–8.0)

## 2017-02-19 LAB — LIPID PANEL
CHOL/HDL RATIO: 3.5 ratio (ref <5.0)
CHOLESTEROL: 133 mg/dL (ref <200)
HDL: 38 mg/dL — ABNORMAL LOW (ref >40)
LDL CALC: 68 mg/dL (ref <100)
Triglycerides: 137 mg/dL (ref <150)
VLDL: 27 mg/dL (ref <30)

## 2017-02-19 MED ORDER — ATORVASTATIN CALCIUM 10 MG PO TABS: 10.0000 mg | ORAL_TABLET | Freq: Every day | ORAL | 3 refills | Status: DC

## 2017-02-19 MED ORDER — ASPIRIN EC 81 MG PO TBEC: 81.0000 mg | DELAYED_RELEASE_TABLET | Freq: Every day | ORAL | 3 refills | Status: DC

## 2017-02-19 MED ORDER — OMEPRAZOLE 20 MG PO CPDR: 20.0000 mg | DELAYED_RELEASE_CAPSULE | Freq: Every day | ORAL | 3 refills | Status: DC | PRN

## 2017-02-19 MED ORDER — LISINOPRIL-HYDROCHLOROTHIAZIDE 20-12.5 MG PO TABS: 1.0000 | ORAL_TABLET | Freq: Every day | ORAL | 3 refills | Status: DC

## 2017-02-19 NOTE — Patient Instructions (Addendum)
Recommendations: See your eye doctor yearly for routine vision care.  See your dentist yearly for routine dental care including hygiene visits twice yearly.  See me every 3 months for diabetes, yearly for physical  We will call with lab results  Diabetes:  Check your sugars at least daily in the mornings  In general if your sugars were running ok, but are not in the 150+ range, call or come in.   We want to keep your sugars under 130 in the mornings  It sounds like your recent sugars have been way too high  We will call with medication changes  You should be taking Glimepiride and Xigduo, but NOT separate Metformin.     Pending labs, we will likely make some changes since your sugars are running high  Vaccines:  Get your flu shot this September when we get them in  I recommend you have a shingles vaccine to help prevent shingles or herpes zoster outbreak.   Please call your insurer to inquire about coverage for the Shingrix vaccine given in 2 doses.   Some insurers cover this vaccine after age 44, some cover this after age 7.  If your insurer covers this, then call to schedule appointment to have this vaccine here.  I also recommend you check your insurance coverage for Prevnar 13 vaccination as well  Cholesterol  You should continue Aspirin 81mg  nightly   You should be taking a medication called Atorvastatin/Lipitor 10mg  daily in the evening with aspirin to lower cholesterol which also lowers heart disease risk.  I will send this to the pharmacy  Blood pressure  Continue Lisinopril HCT 20/12.5mg  daily in the morning

## 2017-02-19 NOTE — Progress Notes (Signed)
Subjective:   HPI  Jeffrey Rodriguez is a 51 y.o. male who presents for physical and recheck on medications Chief Complaint  Patient presents with  . Annual Exam    physical ,pt is fasting for labs    Medical care team includes: Tysinger, Camelia Eng, PA-C here for primary care Dentist Eye doctor Dr. Wilfrid Lund, GI  Concerns: Diabetes - checks sugars several days per week and lately numbers in the high 200s.  Had recent HgbA1C at work that was reportedly 12%.   Drinks Gatorade and water at work as it stays hot.  He sweats throughout the day at work.   He reports taking Metformin + Xigduo + glimepiride currently although this was not the recommendation last visit.   complication with other medications except says he never got the statin from the pharmacy.  Reviewed their medical, surgical, family, social, medication, and allergy history and updated chart as appropriate.  Past Medical History:  Diagnosis Date  . Diabetes mellitus without complication ([place]) 8295  . GERD (gastroesophageal reflux disease)   . Hypertension 2010  . Microalbuminuria 11/2013  . Mixed dyslipidemia    diet and exercise  . Obesity   . Smoker    .5 ppd  . Wears glasses     Past Surgical History:  Procedure Laterality Date  . COLONOSCOPY  12/2015   tubular adenoma, Dr. Wilfrid Lund  . WISDOM TOOTH EXTRACTION      Social History   Social History  . Marital status: Married    Spouse name: N/A  . Number of children: N/A  . Years of education: N/A   Occupational History  . Not on file.   Social History Main Topics  . Smoking status: Current Every Day Smoker    Packs/day: 0.50    Years: 26.00    Types: Cigarettes  . Smokeless tobacco: Never Used  . Alcohol use No  . Drug use: No  . Sexual activity: Not on file   Other Topics Concern  . Not on file   Social History Narrative   Lives with wife, no children, works for [employer], exercise - walking.  Has been doing some swimming. 02/2017     Family History  Problem Relation Age of Onset  . Pneumonia Father   . Arthritis Mother   . Depression Brother   . Hypertension Other   . Cancer Neg Hx   . Stroke Neg Hx   . Colon cancer Neg Hx   . Colon polyps Neg Hx   . Esophageal cancer Neg Hx   . Rectal cancer Neg Hx   . Stomach cancer Neg Hx   . Heart disease Neg Hx   . Diabetes Neg Hx      Current Outpatient Prescriptions:  .  aspirin EC 81 MG tablet, Take 1 tablet (81 mg total) by mouth daily., Disp: 90 tablet, Rfl: 3 .  Dapagliflozin-Metformin HCl ER (XIGDUO XR) 11-998 MG TB24, Take 1 tablet by mouth daily., Disp: 90 tablet, Rfl: 3 .  glimepiride (AMARYL) 4 MG tablet, TAKE ONE TABLET BY MOUTH ONCE DAILY WITH BREAKFAST, Disp: 90 tablet, Rfl: 3 .  ibuprofen (ADVIL,MOTRIN) 800 MG tablet, Take 1 tablet (800 mg total) by mouth 3 (three) times daily. Take with food., Disp: 30 tablet, Rfl: 0 .  lisinopril-hydrochlorothiazide (PRINZIDE,ZESTORETIC) 20-12.5 MG tablet, Take 1 tablet by mouth daily., Disp: 90 tablet, Rfl: 3 .  omeprazole (PRILOSEC) 20 MG capsule, Take 1 capsule (20 mg total) by mouth daily as needed.  Reported on 01/12/2016, Disp: 90 capsule, Rfl: 3 .  atorvastatin (LIPITOR) 10 MG tablet, Take 1 tablet (10 mg total) by mouth daily., Disp: 90 tablet, Rfl: 3  Current Facility-Administered Medications:  .  triamcinolone acetonide (KENALOG-40) injection 40 mg, 40 mg, Intramuscular, Once, Denita Lung, MD  No Known Allergies   Review of Systems Constitutional: -fever, -chills, -sweats, -unexpected weight change, -decreased appetite, -fatigue Allergy: -sneezing, -itching, -congestion Dermatology: -changing moles, --rash, -lumps ENT: -runny nose, -ear pain, -sore throat, -hoarseness, -sinus pain, -teeth pain, - ringing in ears, -hearing loss, -nosebleeds Cardiology: -chest pain, -palpitations, -swelling, -difficulty breathing when lying flat, -waking up short of breath Respiratory: -cough, -shortness of breath,  -difficulty breathing with exercise or exertion, -wheezing, -coughing up blood Gastroenterology: -abdominal pain, -nausea, -vomiting, -diarrhea, -constipation, -blood in stool, -changes in bowel movement, -difficulty swallowing or eating Hematology: -bleeding, -bruising  Musculoskeletal: -joint aches, -muscle aches, -joint swelling, -back pain, -neck pain, -cramping, -changes in gait Ophthalmology: denies vision changes, eye redness, itching, discharge Urology: -burning with urination, -difficulty urinating, -blood in urine, -urinary frequency, -urgency, -incontinence Neurology: -headache, -weakness, -tingling, -numbness, -memory loss, -falls, -dizziness Psychology: -depressed mood, -agitation, -sleep problems +polyuria, +polydipsia      Objective:   BP 112/80   Pulse 80   Ht 5\' 10"  (1.778 m)   Wt 217 lb 3.2 oz (98.5 kg)   SpO2 96%   BMI 31.16 kg/m   General appearance: alert, no distress, WD/WN, African American male Skin: no new worrisome lesions HEENT: normocephalic, conjunctiva/corneas normal, sclerae anicteric, PERRLA, EOMi, nares patent, no discharge or erythema, pharynx normal Oral cavity: MMM, tongue normal, teeth normal Neck: supple, no lymphadenopathy, no thyromegaly, no masses, normal ROM, no bruits Chest: non tender, normal shape and expansion Heart: RRR, normal S1, S2, no murmurs Lungs: CTA bilaterally, no wheezes, rhonchi, or rales Abdomen: +bs, soft, non tender, non distended, no masses, no hepatomegaly, no splenomegaly, no bruits Back: non tender, normal ROM, no scoliosis Musculoskeletal: upper extremities non tender, no obvious deformity, normal ROM throughout, lower extremities non tender, no obvious deformity, normal ROM throughout Extremities: no edema, no cyanosis, no clubbing Pulses: 2+ symmetric, upper and lower extremities, normal cap refill Neurological: alert, oriented x 3, CN2-12 intact, strength normal upper extremities and lower extremities, sensation  normal throughout, DTRs 2+ throughout, no cerebellar signs, gait normal Psychiatric: normal affect, behavior normal, pleasant  GU: normal male external genitalia,circumcised, nontender, no masses, no hernia, no lymphadenopathy Rectal: declined   Assessment and Plan :    Encounter Diagnoses  Name Primary?  . Routine general medical examination at a health care facility Yes  . Diabetes mellitus with complication ([place])   . Essential hypertension   . Microalbuminuria   . Smoker   . Mixed dyslipidemia   . Vaccine counseling   . Encounter for hepatitis C screening test for low risk patient     Physical exam - discussed and counseled on healthy lifestyle, diet, exercise, preventative care, vaccinations, sick and well care, proper use of emergency dept and after hours care, and addressed their concerns.    Health screening: See your eye doctor yearly for routine vision care. See your dentist yearly for routine dental care including hygiene visits twice yearly.  Cancer screening Reviewed 2017 colonoscopy, due repeat in 5 years Discussed PSA, prostate exam, and prostate cancer screening risks/benefits.   Discussed prostate symptoms as well.  Prostate screening performed: No. Performed last year.  Vaccinations: Counseled on the following vaccines:  Flu, Shingrix, Prevnar 13.  Up to date on Td and pneumococcal 23  Acute issues discussed: Increase thirst and symptoms suggestive of worsening diabetes control  Separate significant chronic issues discussed: Noncompliance - although we discussed and wrote instructions last visit about beginning statin and stopping metformin, he is still taking metformin along with xigduo and not taking statin. I personally went back over instructions and gave written instructions to verity medications and recommendations today  Diabetes:  Check your sugars at least daily in the mornings  In general if your sugars were running ok, but are not in the 150+  range, call or come in.   We want to keep your sugars under 130 in the mornings  It sounds like your recent sugars have been way too high  We will call with medication changes  You should be taking Glimepiride and Xigduo, but NOT separate Metformin.     Pending labs, we will likely make some changes since your sugars are running high  Cholesterol  You should continue Aspirin 81mg  nightly   You should be taking a medication called Atorvastatin/Lipitor 10mg  daily in the evening with aspirin to lower cholesterol which also lowers heart disease risk.  I will send this to the pharmacy  Blood pressure  Continue Lisinopril HCT 20/12.5mg  daily in the morning   Dallin was seen today for annual exam.  Diagnoses and all orders for this visit:  Routine general medical examination at a health care facility -     POCT Urinalysis DIP (Proadvantage Device) -     CBC -     Comprehensive metabolic panel -     Lipid panel -     Hemoglobin A1c -     Microalbumin / creatinine urine ratio -     HIV antibody -     Hepatitis C antibody  Diabetes mellitus with complication ([facility]) -     Hemoglobin A1c -     HM DIABETES EYE EXAM -     HM DIABETES FOOT EXAM -     Microalbumin / creatinine urine ratio  Essential hypertension  Microalbuminuria  Smoker  Mixed dyslipidemia -     Lipid panel  Vaccine counseling  Encounter for hepatitis C screening test for low risk patient -     Hepatitis C antibody  Other orders -     aspirin EC 81 MG tablet; Take 1 tablet (81 mg total) by mouth daily. -     atorvastatin (LIPITOR) 10 MG tablet; Take 1 tablet (10 mg total) by mouth daily. -     lisinopril-hydrochlorothiazide (PRINZIDE,ZESTORETIC) 20-12.5 MG tablet; Take 1 tablet by mouth daily. -     omeprazole (PRILOSEC) 20 MG capsule; Take 1 capsule (20 mg total) by mouth daily as needed. Reported on 01/12/2016    Follow-up pending labs, yearly for physical

## 2017-02-20 ENCOUNTER — Other Ambulatory Visit: Payer: Self-pay | Admitting: Medical

## 2017-02-20 LAB — MICROALBUMIN / CREATININE URINE RATIO
Creatinine, Urine: 179 mg/dL (ref 20–370)
Microalb Creat Ratio: 17 mcg/mg creat (ref <30)
Microalb, Ur: 3 mg/dL

## 2017-02-20 LAB — HEMOGLOBIN A1C
HEMOGLOBIN A1C: 10.3 % — AB (ref <5.7)
MEAN PLASMA GLUCOSE: 249 mg/dL

## 2017-02-20 LAB — HEPATITIS C ANTIBODY: HCV AB: NONREACTIVE

## 2017-02-20 LAB — HIV ANTIBODY (ROUTINE TESTING W REFLEX): HIV 1&2 Ab, 4th Generation: NONREACTIVE

## 2017-02-20 MED ORDER — INSULIN PEN NEEDLE 32G X 4 MM MISC: 1.0000 | Freq: Every day | 3 refills | Status: DC

## 2017-02-20 MED ORDER — INSULIN GLARGINE-LIXISENATIDE 100-33 UNT-MCG/ML ~~LOC~~ SOPN: 15.0000 [IU] | PEN_INJECTOR | Freq: Every day | SUBCUTANEOUS | 2 refills | Status: DC

## 2017-02-20 MED ORDER — DAPAGLIFLOZIN PRO-METFORMIN ER 5-1000 MG PO TB24: 1.0000 | ORAL_TABLET | Freq: Every day | ORAL | 3 refills | Status: DC

## 2017-02-20 NOTE — Progress Notes (Signed)
Labs show diabetes marker too high and sugar in the 200s as suspected.  Recommendations: STOP plain Metformin STOP Glimepiride Continue Xigduo (dapagliflozin/Metformin combo)  BEGIN a new medication at bedtime called Soliquia.  This is a pen device, very tiny needle injection.  Its easy to use, isn't scary to use, but given how high his sugars are running, we need to change therapy.   This medication has 2 medications in 1.  It has long acting night time insulin and another medication that works on several metabolic pathways to reduce sugars.   I think this would work well for him.  Have him come back for visit to learn how to use the device and for [place] to discuss hypoglycemia measures.  Per the rep, we have a few samples that will expire soon, so I'd like him to have them to make sure of the free medication we have.     Continue monitoring sugars daily with goal of <130 in the mornings fasting.

## 2017-02-21 ENCOUNTER — Ambulatory Visit (INDEPENDENT_AMBULATORY_CARE_PROVIDER_SITE_OTHER): Payer: BLUE CROSS/BLUE SHIELD | Admitting: Medical

## 2017-02-21 DIAGNOSIS — E118 Type 2 diabetes mellitus with unspecified complications: Secondary | ICD-10-CM

## 2017-02-21 DIAGNOSIS — I10 Essential (primary) hypertension: Secondary | ICD-10-CM | POA: Diagnosis not present

## 2017-02-21 MED ORDER — DAPAGLIFLOZIN PRO-METFORMIN ER 10-1000 MG PO TB24: 1.0000 | ORAL_TABLET | ORAL | 0 refills | Status: AC

## 2017-02-21 NOTE — Progress Notes (Signed)
Subjective: Chief Complaint  Patient presents with  . Diabetes   Here for nurse visit as f/u from his physical and diabetes labs earlier this week    Objective There were no vitals taken for this visit.  Not examined   Assessment: Encounter Diagnoses  Name Primary?  . Essential hypertension Yes  . Diabetes mellitus with complication [facility])      Plan: Louretta Shorten, CMA, went over his labs and recommendations per my recommendations.   We also demonstrated use of Soliqua, and discussed avoiding hypoglycemia, gave samples as well.   Recommendations: STOP plain Metformin STOP Glimepiride Continue Xigduo (dapagliflozin/Metformin combo)  BEGIN a new medication at bedtime called Soliquia. This is a pen device, very tiny needle injection. Its easy to use, isn't scary to use, but given how high his sugars are running, we need to change therapy.  This medication has 2 medications in 1. It has long acting night time insulin and another medication that works on several metabolic pathways to reduce sugars.      F/u 29mo

## 2017-03-14 ENCOUNTER — Telehealth: Payer: Self-pay | Admitting: Family Medicine

## 2017-03-14 ENCOUNTER — Other Ambulatory Visit: Payer: Self-pay

## 2017-03-14 DIAGNOSIS — E118 Type 2 diabetes mellitus with unspecified complications: Secondary | ICD-10-CM

## 2017-03-14 MED ORDER — ACCU-CHEK SOFTCLIX LANCET DEV MISC: 3 refills | Status: DC

## 2017-03-14 NOTE — Telephone Encounter (Signed)
Pt called and states he needs the lancet refill for his accucheck [employer] plus goes into the machine drum. [place]

## 2017-03-14 NOTE — Telephone Encounter (Signed)
Sent to pharmacy 

## 2017-03-19 ENCOUNTER — Telehealth: Payer: Self-pay | Admitting: Family Medicine

## 2017-03-19 NOTE — Telephone Encounter (Signed)
[facility] is requesting Accu Check Compact Strips that come in the drum not the lancets

## 2017-03-19 NOTE — Telephone Encounter (Signed)
Called into pharmacy

## 2017-05-23 ENCOUNTER — Ambulatory Visit (INDEPENDENT_AMBULATORY_CARE_PROVIDER_SITE_OTHER): Payer: PRIVATE HEALTH INSURANCE | Admitting: Medical

## 2017-05-23 ENCOUNTER — Encounter: Payer: Self-pay | Admitting: Medical

## 2017-05-23 VITALS — BP 112/72 | HR 84 | Wt 222.4 lb

## 2017-05-23 DIAGNOSIS — E782 Mixed hyperlipidemia: Secondary | ICD-10-CM

## 2017-05-23 DIAGNOSIS — I10 Essential (primary) hypertension: Secondary | ICD-10-CM

## 2017-05-23 DIAGNOSIS — F17200 Nicotine dependence, unspecified, uncomplicated: Secondary | ICD-10-CM

## 2017-05-23 DIAGNOSIS — E118 Type 2 diabetes mellitus with unspecified complications: Secondary | ICD-10-CM | POA: Diagnosis not present

## 2017-05-23 MED ORDER — INSULIN DEGLUDEC-LIRAGLUTIDE 100-3.6 UNIT-MG/ML ~~LOC~~ SOPN: 25.0000 [IU] | PEN_INJECTOR | Freq: Every day | SUBCUTANEOUS | 3 refills | Status: DC

## 2017-05-23 NOTE — Progress Notes (Signed)
Subjective: Chief Complaint  Patient presents with  . Follow-up    3 month follow up , htn   Here for diabetes f/u.    Had recent biometric lab screenings at work including cholesterol and diabetes maker.  HgbA1C 11.4% a few weeks ago Home sugars are all over the place, sometimes low 100s, sometimes 225.   Last visit we changed to 2 new medications. He is taking [name] 15u daily, taking Xigduo 11/998 daily.  Diet:  Eats a lot of baked foods, sometimes fried foods.   Drinks soft drinks at least 2 daily.  Also drinks juice.   No alcohol. Doesn't like taste of sugar free beverages.      HTN - compliant Lisinopril HCT 20/12.5mg , no chest pain, no dyspnea.  hyperlipemia - taking Lipitor 10mg  and ASA nightly without c/o.  Is active on the job, runs a machine at work.  Walks a lot.    Past Medical History:  Diagnosis Date  . Diabetes mellitus without complication ([place]) 7322  . GERD (gastroesophageal reflux disease)   . Hypertension 2010  . Microalbuminuria 11/2013  . Mixed dyslipidemia    diet and exercise  . Obesity   . Smoker    .5 ppd  . Wears glasses    Current Outpatient Medications on File Prior to Visit  Medication Sig Dispense Refill  . aspirin EC 81 MG tablet Take 1 tablet (81 mg total) by mouth daily. 90 tablet 3  . atorvastatin (LIPITOR) 10 MG tablet Take 1 tablet (10 mg total) by mouth daily. 90 tablet 3  . Dapagliflozin-Metformin HCl ER (XIGDUO XR) 11-998 MG TB24 Take 1 tablet by mouth daily. 90 tablet 3  . ibuprofen (ADVIL,MOTRIN) 800 MG tablet Take 1 tablet (800 mg total) by mouth 3 (three) times daily. Take with food. 30 tablet 0  . Insulin Glargine-Lixisenatide (SOLIQUA) 100-33 UNT-MCG/ML SOPN Inject 15 Units into the skin at bedtime. 3 mL 2  . Insulin Pen Needle (BD PEN NEEDLE NANO U/F) 32G X 4 MM MISC 1 each by Does not apply route at bedtime. 100 each 3  . Lancet Devices (ACCU-CHEK SOFTCLIX) lancets Use as instructed 1 each 3  . lisinopril-hydrochlorothiazide  (PRINZIDE,ZESTORETIC) 20-12.5 MG tablet Take 1 tablet by mouth daily. 90 tablet 3  . omeprazole (PRILOSEC) 20 MG capsule Take 1 capsule (20 mg total) by mouth daily as needed. Reported on 01/12/2016 90 capsule 3   Current Facility-Administered Medications on File Prior to Visit  Medication Dose Route Frequency Provider Last Rate Last Dose  . triamcinolone acetonide (KENALOG-40) injection 40 mg  40 mg Intramuscular Once Denita Lung, MD       ROS as in subjective    Objective: BP 112/72   Pulse 84   Wt 222 lb 6.4 oz (100.9 kg)   SpO2 97%   BMI 31.91 kg/m   Wt Readings from Last 3 Encounters:  05/23/17 222 lb 6.4 oz (100.9 kg)  02/19/17 217 lb 3.2 oz (98.5 kg)  07/26/16 223 lb 12.8 oz (101.5 kg)   Gen: wd, wn, nad Otherwise not examined    Assessment: Encounter Diagnoses  Name Primary?  . Essential hypertension Yes  . Diabetes mellitus with complication ([place])   . Mixed dyslipidemia   . Smoker     Plan: I reviewed his recent biometric screen from work showing lipids at goal, but HgbA1C reportedly 11% recently.  Diabetes - spent the majority of the visit counseling on diet, exercise, goals of therapy, medications, focusing on  diet changes.   Cut out soda, juice!!  HTN - c/t same medications  dyslipidemia - reviewed recent biometric labs and lipids.   C/t same medication  Smoker - advised cessation  Had recent flu shot at work.    F/u with sugar readings in 2 weeks.  Jhett was seen today for follow-up.  Diagnoses and all orders for this visit:  Essential hypertension  Diabetes mellitus with complication ([place])  Mixed dyslipidemia  Smoker  Spent > 30 minutes face to face with patient in discussion of symptoms, evaluation, plan and recommendations.

## 2017-05-23 NOTE — Addendum Note (Signed)
Addended by: Carlena Hurl on: 05/23/2017 11:38 AM   Modules accepted: Orders

## 2017-05-23 NOTE — Patient Instructions (Signed)
Recommendations:  I really want you to work on diet changes!!!!  Try to get 45-60 minutes of exercise daily such as walking, playing ball, riding a bike, hiking, etc.  Check sugars fasting in the morning and before meals  Write the sugar numbers down and get them to me in 1-2 weeks  Continue all medications including Xigduo tablet daily.  However, if insurance doesn't cover [place], we will change to [place].  This is very similar  You can begin Xultophy at 18 units daily at bedtime  If you morning sugars are >130, you can increase the dose of [place] 2 units every week until seeing consistent sugars under 130 fasting in the morning  Let me know ASAP if Xultophy isn't covered    Specific recommendations today include: Diet  Increase your water intake, get at least 64 ounces of water daily  Eat 3-4 fruits daily  Eat plenty of vegetables throughout the day, preferably each meal  Eat good sources of grains such as oatmeal, barley, whole grain pasta, whole grain bread, but limit the serving size to 1 cup of oatmeal or pasta per meal or 2 slices of bread per meal  We don't need to meat at each meal, however if you do eat meat, limit serving size to the size of your palm, and eat chicken fish or [place], lean cuts of meat  Eat beans every day as this is a good nutrient source and helps to curb appetite  Consider using a program such as Weight Watchers  Consider using a Smart phone app such as My Fitness PAL or [employer] to track your calories and progress   Things to limit or avoid:  Avoid fast food, fried foods, fatty foods  Limit sweets, ice cream, cake and other baked goods  Avoid soda, beer, alcohol, sweet tea

## 2017-08-01 ENCOUNTER — Encounter: Payer: Self-pay | Admitting: Medical

## 2017-08-01 ENCOUNTER — Ambulatory Visit: Payer: PRIVATE HEALTH INSURANCE | Admitting: Medical

## 2017-08-01 VITALS — BP 120/70 | HR 86 | Wt 219.6 lb

## 2017-08-01 DIAGNOSIS — L602 Onychogryphosis: Secondary | ICD-10-CM | POA: Diagnosis not present

## 2017-08-01 DIAGNOSIS — B351 Tinea unguium: Secondary | ICD-10-CM | POA: Diagnosis not present

## 2017-08-01 DIAGNOSIS — F17200 Nicotine dependence, unspecified, uncomplicated: Secondary | ICD-10-CM

## 2017-08-01 DIAGNOSIS — E782 Mixed hyperlipidemia: Secondary | ICD-10-CM | POA: Diagnosis not present

## 2017-08-01 DIAGNOSIS — E118 Type 2 diabetes mellitus with unspecified complications: Secondary | ICD-10-CM | POA: Diagnosis not present

## 2017-08-01 DIAGNOSIS — Z7189 Other specified counseling: Secondary | ICD-10-CM

## 2017-08-01 DIAGNOSIS — B353 Tinea pedis: Secondary | ICD-10-CM | POA: Diagnosis not present

## 2017-08-01 DIAGNOSIS — I10 Essential (primary) hypertension: Secondary | ICD-10-CM | POA: Diagnosis not present

## 2017-08-01 DIAGNOSIS — R809 Proteinuria, unspecified: Secondary | ICD-10-CM

## 2017-08-01 DIAGNOSIS — Z7185 Encounter for immunization safety counseling: Secondary | ICD-10-CM

## 2017-08-01 DIAGNOSIS — E119 Type 2 diabetes mellitus without complications: Secondary | ICD-10-CM | POA: Diagnosis not present

## 2017-08-01 LAB — POCT URINALYSIS DIP (PROADVANTAGE DEVICE)
BILIRUBIN UA: NEGATIVE mg/dL
Bilirubin, UA: NEGATIVE
Blood, UA: NEGATIVE
Glucose, UA: NEGATIVE mg/dL
LEUKOCYTES UA: NEGATIVE
NITRITE UA: NEGATIVE
PH UA: 6 (ref 5.0–8.0)
PROTEIN UA: NEGATIVE mg/dL
Specific Gravity, Urine: 1.03
Urobilinogen, Ur: NEGATIVE

## 2017-08-01 LAB — POCT GLYCOSYLATED HEMOGLOBIN (HGB A1C): Hemoglobin A1C: 8.8

## 2017-08-01 MED ORDER — EMPAGLIFLOZIN-METFORMIN HCL ER 10-1000 MG PO TB24: 1.0000 | ORAL_TABLET | Freq: Every day | ORAL | 3 refills | Status: DC

## 2017-08-01 MED ORDER — TERBINAFINE HCL 250 MG PO TABS: 250.0000 mg | ORAL_TABLET | Freq: Every day | ORAL | 0 refills | Status: DC

## 2017-08-01 MED ORDER — INSULIN DEGLUDEC-LIRAGLUTIDE 100-3.6 UNIT-MG/ML ~~LOC~~ SOPN: 25.0000 [IU] | PEN_INJECTOR | Freq: Every day | SUBCUTANEOUS | 3 refills | Status: DC

## 2017-08-01 NOTE — Progress Notes (Signed)
Subjective: Chief Complaint  Patient presents with  . Diabetes    dm check , no other concerns    Here for diabetes check.  Diabetes-compliant with Xultophy injection 25 units daily, but not taking Xigduo since last 2 weeks.   Insurance wont pay for [employer].  no hypoglycemic episodes, no foot lesions, no polyuria, polydipsia.  Checking sugars, recently got a new glucometer.  Seeing 100-130 fasting.     Hyperlipidemia-compliant with Lipitor 10 mg and aspirin 81 mg daily   hypertension-compliant with lisinopril HCT 20/12.5 mg daily  GERD-taking over-the-counter Prilosec  Exercise-some, moderate  Diet-trying to eat healthy, but sometimes unhealthy things.  Has right thumb nail cracked for 2-3 years.   Has thickened great toenails.    Past Medical History:  Diagnosis Date  . Diabetes mellitus without complication ([place]) 6160  . GERD (gastroesophageal reflux disease)   . Hypertension 2010  . Microalbuminuria 11/2013  . Mixed dyslipidemia    diet and exercise  . Obesity   . Smoker    .5 ppd  . Wears glasses    Current Outpatient Medications on File Prior to Visit  Medication Sig Dispense Refill  . aspirin EC 81 MG tablet Take 1 tablet (81 mg total) by mouth daily. 90 tablet 3  . atorvastatin (LIPITOR) 10 MG tablet Take 1 tablet (10 mg total) by mouth daily. 90 tablet 3  . ibuprofen (ADVIL,MOTRIN) 800 MG tablet Take 1 tablet (800 mg total) by mouth 3 (three) times daily. Take with food. 30 tablet 0  . Insulin Pen Needle (BD PEN NEEDLE NANO U/F) 32G X 4 MM MISC 1 each by Does not apply route at bedtime. 100 each 3  . Lancet Devices (ACCU-CHEK SOFTCLIX) lancets Use as instructed 1 each 3  . lisinopril-hydrochlorothiazide (PRINZIDE,ZESTORETIC) 20-12.5 MG tablet Take 1 tablet by mouth daily. 90 tablet 3  . omeprazole (PRILOSEC) 20 MG capsule Take 1 capsule (20 mg total) by mouth daily as needed. Reported on 01/12/2016 90 capsule 3   Current Facility-Administered Medications on File  Prior to Visit  Medication Dose Route Frequency Provider Last Rate Last Dose  . triamcinolone acetonide (KENALOG-40) injection 40 mg  40 mg Intramuscular Once Denita Lung, MD       ROS as in subjective   Objective: BP 120/70   Pulse 86   Wt 219 lb 9.6 oz (99.6 kg)   SpO2 98%   BMI 31.51 kg/m    Wt Readings from Last 3 Encounters:  08/01/17 219 lb 9.6 oz (99.6 kg)  05/23/17 222 lb 6.4 oz (100.9 kg)  02/19/17 217 lb 3.2 oz (98.5 kg)   BP Readings from Last 3 Encounters:  08/01/17 120/70  05/23/17 112/72  02/19/17 112/80   General appearance: alert, no distress, WD/WN,  Neck: supple, no lymphadenopathy, no thyromegaly, no masses, no bruits Heart: RRR, normal S1, S2, no murmurs Lungs: CTA bilaterally, no wheezes, rhonchi, or rales Abdomen: +bs, soft, non tender, non distended, no masses, no hepatomegaly, no splenomegaly Pulses: 2+ symmetric, upper and lower extremities, normal cap refill Right thumb nail medial third with a linear crack thickening tissue under the nail and yellowish looking nail, both great toenails thickened and brownish green, several toenails with yellow discoloration and thickening  Diabetic Foot Exam - Simple   Simple Foot Form Visual Inspection See comments:  Yes Sensation Testing Intact to touch and monofilament testing bilaterally:  Yes Pulse Check Posterior Tibialis and Dorsalis pulse intact bilaterally:  Yes Comments Great toenails bilaterally thickened  yellowish nails, rest of nails yellowish but not as thickened, slight maceration between the left fourth and fifth toe consistent with tinea pedis      Assessment: Encounter Diagnoses  Name Primary?  . Diabetes mellitus with complication ([place]) Yes  . Diabetes mellitus without complication ([place])   . Essential hypertension   . Microalbuminuria   . Mixed dyslipidemia   . Vaccine counseling   . Smoker   . Onychomycosis   . Tinea pedis of both feet   . Hypertrophic toenail      Plan: Diabetes- insurance will no longer cover Xigduo.  C/t Xultophy, add Syjardy in place of [place].  Continue to work on healthy diet, regular glucose monitoring, daily foot checks, yearly eye doctor visit.  Hypertension and microalbuminuria-Continue same medication  Hyperlipidemia-reviewed labs from August 2018 which are at goal.  We will plan on checking this yearly.  Continue statin, aspirin, and try to use a heart healthy cholesterol diet.  Obesity-continue efforts to lose weight  Counseled on smoking, he is not ready to quit.   Onychomycosis, tinea-begin Lamisil oral tablet daily for the next 4-6 weeks.  Plan on returning in 6 weeks for CMET lab.  Discussed time frame to see improvement, discussed risk and benefits of medication.  Toenail hypertrophy - refer to podiatry   Jeffrey Rodriguez was seen today for diabetes.  Diagnoses and all orders for this visit:  Diabetes mellitus with complication ([place]) -     Hepatic function panel; Future  Diabetes mellitus without complication ([place]) Comments: ERROR diagnosis Orders: -     HgB A1c -     POCT Urinalysis DIP (Proadvantage Device)  Essential hypertension  Microalbuminuria  Mixed dyslipidemia -     Hepatic function panel; Future  Vaccine counseling  Smoker  Onychomycosis  Tinea pedis of both feet  Hypertrophic toenail  Other orders -     Insulin Degludec-Liraglutide (XULTOPHY) 100-3.6 UNIT-MG/ML SOPN; Inject 25 Units into the skin daily. -     Empagliflozin-Metformin HCl ER (SYNJARDY XR) 04-999 MG TB24; Take 1 tablet by mouth daily. -     terbinafine (LAMISIL) 250 MG tablet; Take 1 tablet (250 mg total) by mouth daily.

## 2017-08-04 ENCOUNTER — Telehealth: Payer: Self-pay | Admitting: Medical

## 2017-08-04 NOTE — Telephone Encounter (Signed)
Pt given sample on Friday, ok per Audelia Acton to hold til P.A. Resolved

## 2017-08-06 ENCOUNTER — Other Ambulatory Visit: Payer: Self-pay | Admitting: Medical

## 2017-08-06 MED ORDER — EXENATIDE ER 2 MG ~~LOC~~ PEN: 2.0000 mg | PEN_INJECTOR | SUBCUTANEOUS | 5 refills | Status: DC

## 2017-08-06 NOTE — Telephone Encounter (Signed)
Let him know about insurance denial.   Begin once weekly B-cise pen instead of Xultophy injection daily.    Its the same kind of medication but he will notice a nodule under the skin with this medication, that is normal.

## 2017-08-06 NOTE — Telephone Encounter (Signed)
P.A. Claris Che denied, pt needs trial and failure of either Bydureon, Bydureon Bcise, Lantus, Levemir, Toujeo,Trulicity or Victoza.  Do you want to switch?

## 2017-08-07 ENCOUNTER — Telehealth: Payer: Self-pay | Admitting: Medical

## 2017-08-07 NOTE — Telephone Encounter (Signed)
Changed to Bydureon, which requires P.A. Will open new telephone call

## 2017-08-07 NOTE — Telephone Encounter (Signed)
P.A. BYDUREON BCISE initiated, and also printed discount card for when this is approved

## 2017-08-09 NOTE — Telephone Encounter (Signed)
Bydureon approved, called pharmacy & got discount card to go thru for $0, pt informed

## 2017-08-10 ENCOUNTER — Telehealth: Payer: Self-pay | Admitting: Medical

## 2017-08-10 NOTE — Telephone Encounter (Signed)
Pharmacist also said [place] Burn also required.  Called pt and he was out of his meds, so samples given #28 on Friday ,ok per Hilaria Ota initiated

## 2017-08-13 NOTE — Telephone Encounter (Signed)
P.A. Approved til 08/09/18, called pharmacy and went thru for $100, activated discount card and went down to $0 per month.  Called pt and informed

## 2017-09-12 ENCOUNTER — Ambulatory Visit (INDEPENDENT_AMBULATORY_CARE_PROVIDER_SITE_OTHER): Payer: PRIVATE HEALTH INSURANCE | Admitting: Ophthalmology

## 2017-09-12 DIAGNOSIS — H35043 Retinal micro-aneurysms, unspecified, bilateral: Secondary | ICD-10-CM | POA: Diagnosis not present

## 2017-09-12 DIAGNOSIS — H2511 Age-related nuclear cataract, right eye: Secondary | ICD-10-CM

## 2017-09-12 DIAGNOSIS — E113291 Type 2 diabetes mellitus with mild nonproliferative diabetic retinopathy without macular edema, right eye: Secondary | ICD-10-CM

## 2017-09-12 DIAGNOSIS — E113392 Type 2 diabetes mellitus with moderate nonproliferative diabetic retinopathy without macular edema, left eye: Secondary | ICD-10-CM | POA: Diagnosis not present

## 2017-09-12 DIAGNOSIS — H34831 Tributary (branch) retinal vein occlusion, right eye, with macular edema: Secondary | ICD-10-CM | POA: Diagnosis not present

## 2017-09-12 DIAGNOSIS — E11319 Type 2 diabetes mellitus with unspecified diabetic retinopathy without macular edema: Secondary | ICD-10-CM

## 2017-09-12 DIAGNOSIS — H353132 Nonexudative age-related macular degeneration, bilateral, intermediate dry stage: Secondary | ICD-10-CM | POA: Diagnosis not present

## 2017-09-12 DIAGNOSIS — H43813 Vitreous degeneration, bilateral: Secondary | ICD-10-CM

## 2017-12-11 ENCOUNTER — Encounter: Payer: Self-pay | Admitting: Medical

## 2017-12-11 ENCOUNTER — Ambulatory Visit: Payer: PRIVATE HEALTH INSURANCE | Admitting: Medical

## 2017-12-11 ENCOUNTER — Ambulatory Visit
Admission: RE | Admit: 2017-12-11 | Discharge: 2017-12-11 | Disposition: A | Discharge status: Home / Self Care | Payer: BLUE CROSS/BLUE SHIELD | Source: Ambulatory Visit | Attending: Medical | Admitting: Medical

## 2017-12-11 VITALS — BP 120/80 | HR 72 | Temp 98.1°F | Resp 16 | Ht 70.0 in | Wt 212.0 lb

## 2017-12-11 DIAGNOSIS — M25561 Pain in right knee: Secondary | ICD-10-CM

## 2017-12-11 MED ORDER — NAPROXEN 500 MG PO TABS: 500.0000 mg | ORAL_TABLET | Freq: Two times a day (BID) | ORAL | 0 refills | Status: DC

## 2017-12-11 NOTE — Progress Notes (Signed)
Subjective: Chief Complaint  Patient presents with  . Knee Pain    right knee pain x 1 week  not work related    Here for right knee pain for the past week.  Can't bear as much weight as usual.   Thinks its arthritis.  Maybe some slight swelling.   No recent fall or injury.  No recent strenuous activity.   Used some tylenol arthritis OTC.  No fever, no numbness or tingling.  No other aggravating or relieving factors. No other complaint.  Past Medical History:  Diagnosis Date  . Diabetes mellitus without complication ([place]) 7902  . GERD (gastroesophageal reflux disease)   . Hypertension 2010  . Microalbuminuria 11/2013  . Mixed dyslipidemia    diet and exercise  . Obesity   . Smoker    .5 ppd  . Wears glasses    Current Outpatient Medications on File Prior to Visit  Medication Sig Dispense Refill  . aspirin EC 81 MG tablet Take 1 tablet (81 mg total) by mouth daily. 90 tablet 3  . atorvastatin (LIPITOR) 10 MG tablet Take 1 tablet (10 mg total) by mouth daily. 90 tablet 3  . Empagliflozin-Metformin HCl ER (SYNJARDY XR) 04-999 MG TB24 Take 1 tablet by mouth daily. 30 tablet 3  . Exenatide ER (BYDUREON) 2 MG PEN Inject 2 mg into the skin once a week. 1 each 5  . Insulin Pen Needle (BD PEN NEEDLE NANO U/F) 32G X 4 MM MISC 1 each by Does not apply route at bedtime. 100 each 3  . Lancet Devices (ACCU-CHEK SOFTCLIX) lancets Use as instructed 1 each 3  . lisinopril-hydrochlorothiazide (PRINZIDE,ZESTORETIC) 20-12.5 MG tablet Take 1 tablet by mouth daily. 90 tablet 3  . omeprazole (PRILOSEC) 20 MG capsule Take 1 capsule (20 mg total) by mouth daily as needed. Reported on 01/12/2016 90 capsule 3  . ibuprofen (ADVIL,MOTRIN) 800 MG tablet Take 1 tablet (800 mg total) by mouth 3 (three) times daily. Take with food. (Patient not taking: Reported on 12/11/2017) 30 tablet 0   Current Facility-Administered Medications on File Prior to Visit  Medication Dose Route Frequency Provider Last Rate Last Dose   . triamcinolone acetonide (KENALOG-40) injection 40 mg  40 mg Intramuscular Once Denita Lung, MD       ROS as in subjective    Objective: BP 120/80   Pulse 72   Temp 98.1 F (36.7 C) (Oral)   Resp 16   Ht 5\' 10"  (1.778 m)   Wt 212 lb (96.2 kg)   SpO2 97%   BMI 30.42 kg/m   Gen: wd, wn, nad Skin: No erythema, no bruising MSK: Right knee nontender, no deformity, no laxity, no other tenderness, normal range of motion of hip knee and ankle, no bony arthritic deformity Legs neurovascularly intact     Assessment: Encounter Diagnosis  Name Primary?  . Acute pain of right knee Yes      Plan: will send for an x-ray since the exam is normal and there is no obvious precipitating factor.    Possible OA, possible gout.     Abhijay was seen today for knee pain.  Diagnoses and all orders for this visit:  Acute pain of right knee -     DG Knee Complete 4 Views Right; Future  Other orders -     naproxen (NAPROSYN) 500 MG tablet; Take 1 tablet (500 mg total) by mouth 2 (two) times daily with a meal.

## 2017-12-24 ENCOUNTER — Other Ambulatory Visit: Payer: Self-pay | Admitting: Medical

## 2018-01-09 ENCOUNTER — Encounter (INDEPENDENT_AMBULATORY_CARE_PROVIDER_SITE_OTHER): Payer: PRIVATE HEALTH INSURANCE | Admitting: Ophthalmology

## 2018-01-09 ENCOUNTER — Other Ambulatory Visit: Payer: Self-pay | Admitting: Medical

## 2018-01-09 ENCOUNTER — Telehealth: Payer: Self-pay | Admitting: Medical

## 2018-01-09 MED ORDER — NAPROXEN 500 MG PO TABS: 500.0000 mg | ORAL_TABLET | Freq: Two times a day (BID) | ORAL | 0 refills | Status: DC

## 2018-01-09 NOTE — Telephone Encounter (Signed)
This was fill today for a 30 day supply, they are asking for a 90 day supply.

## 2018-01-09 NOTE — Telephone Encounter (Signed)
I refilled, but if still having a lot of pain, we may want to try a shot of steroid or other modalities to help with pain.  Recommend he return for recheck and other treatment options or referral to ortho

## 2018-01-09 NOTE — Telephone Encounter (Signed)
Pt called and stated that he continues to have issues same as his last visit. At that time he was given a rx for Naprosyn. He is requesting a refill as he is not out. Please send to [facility] Pt can be reached at [phone].

## 2018-01-15 ENCOUNTER — Encounter (INDEPENDENT_AMBULATORY_CARE_PROVIDER_SITE_OTHER): Payer: PRIVATE HEALTH INSURANCE | Admitting: Ophthalmology

## 2018-01-15 DIAGNOSIS — H353132 Nonexudative age-related macular degeneration, bilateral, intermediate dry stage: Secondary | ICD-10-CM | POA: Diagnosis not present

## 2018-01-15 DIAGNOSIS — H34831 Tributary (branch) retinal vein occlusion, right eye, with macular edema: Secondary | ICD-10-CM

## 2018-01-15 DIAGNOSIS — E113293 Type 2 diabetes mellitus with mild nonproliferative diabetic retinopathy without macular edema, bilateral: Secondary | ICD-10-CM

## 2018-01-15 DIAGNOSIS — I10 Essential (primary) hypertension: Secondary | ICD-10-CM

## 2018-01-15 DIAGNOSIS — E11311 Type 2 diabetes mellitus with unspecified diabetic retinopathy with macular edema: Secondary | ICD-10-CM

## 2018-01-15 DIAGNOSIS — H35033 Hypertensive retinopathy, bilateral: Secondary | ICD-10-CM | POA: Diagnosis not present

## 2018-01-15 DIAGNOSIS — H43813 Vitreous degeneration, bilateral: Secondary | ICD-10-CM

## 2018-02-20 ENCOUNTER — Telehealth: Payer: Self-pay | Admitting: Medical

## 2018-02-20 ENCOUNTER — Other Ambulatory Visit: Payer: Self-pay | Admitting: Medical

## 2018-02-20 NOTE — Telephone Encounter (Signed)
This has already been done.

## 2018-02-20 NOTE — Telephone Encounter (Signed)
Fax from [facility]  Atorvastatin 10 mg

## 2018-03-04 ENCOUNTER — Other Ambulatory Visit: Payer: Self-pay | Admitting: Medical

## 2018-03-26 ENCOUNTER — Other Ambulatory Visit: Payer: Self-pay | Admitting: Medical

## 2018-03-26 ENCOUNTER — Other Ambulatory Visit: Payer: Self-pay

## 2018-03-26 DIAGNOSIS — E118 Type 2 diabetes mellitus with unspecified complications: Secondary | ICD-10-CM

## 2018-03-26 DIAGNOSIS — E785 Hyperlipidemia, unspecified: Secondary | ICD-10-CM

## 2018-03-26 MED ORDER — ATORVASTATIN CALCIUM 10 MG PO TABS: 10.0000 mg | ORAL_TABLET | Freq: Every day | ORAL | 0 refills | Status: DC

## 2018-03-26 MED ORDER — EXENATIDE ER 2 MG ~~LOC~~ PEN: 2.0000 mg | PEN_INJECTOR | SUBCUTANEOUS | 5 refills | Status: DC

## 2018-03-27 ENCOUNTER — Encounter: Payer: Self-pay | Admitting: Medical

## 2018-03-27 ENCOUNTER — Other Ambulatory Visit: Payer: Self-pay | Admitting: Medical

## 2018-03-27 ENCOUNTER — Ambulatory Visit (INDEPENDENT_AMBULATORY_CARE_PROVIDER_SITE_OTHER): Payer: PRIVATE HEALTH INSURANCE | Admitting: Medical

## 2018-03-27 VITALS — BP 116/70 | HR 74 | Temp 98.1°F | Resp 16 | Ht 70.0 in | Wt 207.6 lb

## 2018-03-27 DIAGNOSIS — Z125 Encounter for screening for malignant neoplasm of prostate: Secondary | ICD-10-CM

## 2018-03-27 DIAGNOSIS — Z7189 Other specified counseling: Secondary | ICD-10-CM

## 2018-03-27 DIAGNOSIS — Z0000 Encounter for general adult medical examination without abnormal findings: Secondary | ICD-10-CM

## 2018-03-27 DIAGNOSIS — E118 Type 2 diabetes mellitus with unspecified complications: Secondary | ICD-10-CM

## 2018-03-27 DIAGNOSIS — Z23 Encounter for immunization: Secondary | ICD-10-CM | POA: Diagnosis not present

## 2018-03-27 DIAGNOSIS — R809 Proteinuria, unspecified: Secondary | ICD-10-CM

## 2018-03-27 DIAGNOSIS — I10 Essential (primary) hypertension: Secondary | ICD-10-CM | POA: Diagnosis not present

## 2018-03-27 DIAGNOSIS — E782 Mixed hyperlipidemia: Secondary | ICD-10-CM

## 2018-03-27 DIAGNOSIS — Z7185 Encounter for immunization safety counseling: Secondary | ICD-10-CM

## 2018-03-27 DIAGNOSIS — F17200 Nicotine dependence, unspecified, uncomplicated: Secondary | ICD-10-CM

## 2018-03-27 LAB — POCT URINALYSIS DIP (PROADVANTAGE DEVICE)
BILIRUBIN UA: NEGATIVE
BILIRUBIN UA: NEGATIVE mg/dL
Blood, UA: NEGATIVE
LEUKOCYTES UA: NEGATIVE
Nitrite, UA: NEGATIVE
PH UA: 6 (ref 5.0–8.0)
Protein Ur, POC: NEGATIVE mg/dL
Specific Gravity, Urine: 1.015
Urobilinogen, Ur: NEGATIVE

## 2018-03-27 MED ORDER — INSULIN GLARGINE-LIXISENATIDE 100-33 UNT-MCG/ML ~~LOC~~ SOPN: 15.0000 [IU] | PEN_INJECTOR | Freq: Every day | SUBCUTANEOUS | 5 refills | Status: DC

## 2018-03-27 MED ORDER — ASPIRIN 81 MG PO TBEC: 81.0000 mg | DELAYED_RELEASE_TABLET | Freq: Every day | ORAL | 3 refills | Status: DC

## 2018-03-27 MED ORDER — LISINOPRIL-HYDROCHLOROTHIAZIDE 20-12.5 MG PO TABS: 1.0000 | ORAL_TABLET | Freq: Every day | ORAL | 3 refills | Status: DC

## 2018-03-27 MED ORDER — ATORVASTATIN CALCIUM 20 MG PO TABS: 20.0000 mg | ORAL_TABLET | Freq: Every day | ORAL | 3 refills | Status: DC

## 2018-03-27 MED ORDER — EMPAGLIFLOZIN-METFORMIN HCL ER 25-1000 MG PO TB24: 1.0000 | ORAL_TABLET | Freq: Every day | ORAL | 3 refills | Status: DC

## 2018-03-27 MED ORDER — OMEPRAZOLE 20 MG PO CPDR: 20.0000 mg | DELAYED_RELEASE_CAPSULE | Freq: Every day | ORAL | 3 refills | Status: DC | PRN

## 2018-03-27 MED ORDER — INSULIN PEN NEEDLE 32G X 4 MM MISC: 1.0000 | Freq: Every day | 3 refills | Status: DC

## 2018-03-27 NOTE — Telephone Encounter (Signed)
Is this ok to refill?   I thought you sent in a new RX

## 2018-03-27 NOTE — Progress Notes (Signed)
Subjective:   HPI  Jeffrey Rodriguez is a 52 y.o. male who presents for physical and recheck on medications Chief Complaint  Patient presents with  . CPE    CPE   vision july 2019    Medical care team includes: Emric Kowalewski, Camelia Eng, PA-C here for primary care Dentist Eye doctor Dr. Wilfrid Lund, GI  Concerns: None  Reviewed their medical, surgical, family, social, medication, and allergy history and updated chart as appropriate.  Past Medical History:  Diagnosis Date  . Diabetes mellitus without complication ([place]) 2423  . GERD (gastroesophageal reflux disease)   . Hyperlipidemia   . Hypertension 2010  . Microalbuminuria 11/2013  . Mixed dyslipidemia    diet and exercise  . Obesity   . Smoker    .5 ppd  . Wears glasses     Past Surgical History:  Procedure Laterality Date  . COLONOSCOPY  12/2015   tubular adenoma, Dr. Wilfrid Lund  . WISDOM TOOTH EXTRACTION      Social History   Socioeconomic History  . Marital status: Married    Spouse name: Not on file  . Number of children: Not on file  . Years of education: Not on file  . Highest education level: Not on file  Occupational History  . Not on file  Social Needs  . Financial resource strain: Not on file  . Food insecurity:    Worry: Not on file    Inability: Not on file  . Transportation needs:    Medical: Not on file    Non-medical: Not on file  Tobacco Use  . Smoking status: Current Every Day Smoker    Packs/day: 0.50    Years: 26.00    Pack years: 13.00    Types: Cigarettes  . Smokeless tobacco: Never Used  Substance and Sexual Activity  . Alcohol use: No    Alcohol/week: 0.0 standard drinks  . Drug use: No  . Sexual activity: Not on file  Lifestyle  . Physical activity:    Days per week: Not on file    Minutes per session: Not on file  . Stress: Not on file  Relationships  . Social connections:    Talks on phone: Not on file    Gets together: Not on file    Attends religious service: Not on  file    Active member of club or organization: Not on file    Attends meetings of clubs or organizations: Not on file    Relationship status: Not on file  . Intimate partner violence:    Fear of current or ex partner: Not on file    Emotionally abused: Not on file    Physically abused: Not on file    Forced sexual activity: Not on file  Other Topics Concern  . Not on file  Social History Narrative   Lives with wife, no children, works for [employer], exercise - walking.   03/2018    Family History  Problem Relation Age of Onset  . Pneumonia Father   . Arthritis Mother   . Depression Brother   . Hypertension Other   . Cancer Neg Hx   . Stroke Neg Hx   . Colon cancer Neg Hx   . Colon polyps Neg Hx   . Esophageal cancer Neg Hx   . Rectal cancer Neg Hx   . Stomach cancer Neg Hx   . Heart disease Neg Hx   . Diabetes Neg Hx      Current  Outpatient Medications:  .  aspirin (EQ ASPIRIN ADULT LOW DOSE) 81 MG EC tablet, Take 1 tablet (81 mg total) by mouth daily. Swallow whole., Disp: 90 tablet, Rfl: 3 .  Insulin Pen Needle (BD PEN NEEDLE NANO U/F) 32G X 4 MM MISC, 1 each by Does not apply route at bedtime., Disp: 100 each, Rfl: 3 .  Lancet Devices (ACCU-CHEK SOFTCLIX) lancets, Use as instructed, Disp: 1 each, Rfl: 3 .  lisinopril-hydrochlorothiazide (PRINZIDE,ZESTORETIC) 20-12.5 MG tablet, Take 1 tablet by mouth daily., Disp: 90 tablet, Rfl: 3 .  naproxen (NAPROSYN) 500 MG tablet, Take 1 tablet (500 mg total) by mouth 2 (two) times daily with a meal., Disp: 30 tablet, Rfl: 0 .  omeprazole (PRILOSEC) 20 MG capsule, Take 1 capsule (20 mg total) by mouth daily as needed. Reported on 01/12/2016, Disp: 90 capsule, Rfl: 3 .  atorvastatin (LIPITOR) 20 MG tablet, Take 1 tablet (20 mg total) by mouth daily., Disp: 90 tablet, Rfl: 3 .  Empagliflozin-metFORMIN HCl ER (SYNJARDY XR) 25-1000 MG TB24, Take 1 tablet by mouth daily., Disp: 90 tablet, Rfl: 3 .  ibuprofen (ADVIL,MOTRIN) 800 MG tablet,  Take 1 tablet (800 mg total) by mouth 3 (three) times daily. Take with food. (Patient not taking: Reported on 12/11/2017), Disp: 30 tablet, Rfl: 0 .  Insulin Glargine-Lixisenatide (SOLIQUA) 100-33 UNT-MCG/ML SOPN, Inject 15 Units into the skin daily., Disp: 3 mL, Rfl: 5  Current Facility-Administered Medications:  .  triamcinolone acetonide (KENALOG-40) injection 40 mg, 40 mg, Intramuscular, Once, Denita Lung, MD  No Known Allergies   Review of Systems Constitutional: -fever, -chills, -sweats, -unexpected weight change, -decreased appetite, -fatigue Allergy: -sneezing, -itching, -congestion Dermatology: -changing moles, --rash, -lumps ENT: -runny nose, -ear pain, -sore throat, -hoarseness, -sinus pain, -teeth pain, - ringing in ears, -hearing loss, -nosebleeds Cardiology: -chest pain, -palpitations, -swelling, -difficulty breathing when lying flat, -waking up short of breath Respiratory: -cough, -shortness of breath, -difficulty breathing with exercise or exertion, -wheezing, -coughing up blood Gastroenterology: -abdominal pain, -nausea, -vomiting, -diarrhea, -constipation, -blood in stool, -changes in bowel movement, -difficulty swallowing or eating Hematology: -bleeding, -bruising  Musculoskeletal: -joint aches, -muscle aches, -joint swelling, -back pain, -neck pain, -cramping, -changes in gait Ophthalmology: denies vision changes, eye redness, itching, discharge Urology: -burning with urination, -difficulty urinating, -blood in urine, -urinary frequency, -urgency, -incontinence Neurology: -headache, -weakness, -tingling, -numbness, -memory loss, -falls, -dizziness Psychology: -depressed mood, -agitation, -sleep problems -polyuria, -polydipsia      Objective:   BP 116/70   Pulse 74   Temp 98.1 F (36.7 C) (Oral)   Resp 16   Ht 5\' 10"  (1.778 m)   Wt 207 lb 9.6 oz (94.2 kg)   SpO2 96%   BMI 29.79 kg/m   General appearance: alert, no distress, WD/WN, African American  male Skin: no new worrisome lesions HEENT: normocephalic, conjunctiva/corneas normal, sclerae anicteric, PERRLA, EOMi, nares patent, no discharge or erythema, pharynx normal Oral cavity: MMM, tongue normal, teeth with no obvious deformity Neck: supple, no lymphadenopathy, no thyromegaly, no masses, normal ROM, no bruits Chest: non tender, normal shape and expansion Heart: RRR, normal S1, S2, no murmurs Lungs: CTA bilaterally, no wheezes, rhonchi, or rales Abdomen: +bs, soft, non tender, non distended, no masses, no hepatomegaly, no splenomegaly, no bruits Back: non tender, normal ROM, no scoliosis Musculoskeletal: upper extremities non tender, no obvious deformity, normal ROM throughout, lower extremities non tender, no obvious deformity, normal ROM throughout Extremities: no edema, no cyanosis, no clubbing Pulses: 2+ symmetric, upper and lower extremities,  normal cap refill Neurological: alert, oriented x 3, CN2-12 intact, strength normal upper extremities and lower extremities, sensation normal throughout, DTRs 2+ throughout, no cerebellar signs, gait normal Psychiatric: normal affect, behavior normal, pleasant  GU: normal male external genitalia,circumcised, 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  . Routine general medical examination at a health care facility Yes  . Need for influenza vaccination   . Vaccine counseling   . Essential hypertension   . Diabetes mellitus with complication ([place])   . Mixed dyslipidemia   . Microalbuminuria   . Smoker   . Screening for prostate cancer     Physical exam - discussed and counseled on healthy lifestyle, diet, exercise, preventative care, vaccinations, sick and well care, proper use of emergency dept and after hours care, and addressed their concerns.    Health  screening: See your eye doctor yearly for routine vision care. See your dentist yearly for routine dental care including hygiene visits twice yearly.  Cancer screening Reviewed 2017 colonoscopy, due repeat in 5 years from 2017 Discussed PSA, prostate exam, and prostate cancer screening risks/benefits.   Discussed prostate symptoms as well.   Vaccinations: Counseled on the following vaccines:  Counseled on the influenza virus vaccine.  Vaccine information sheet given.  Influenza vaccine given after consent obtained.  Counseled on Shingrix, he will check insurance coverage.  Up to date on Td and pneumococcal 23   Separate significant chronic issues discussed: Diabetes:  Increase Synjardy dose today  Stop Bydureon, change back to [name] that was tried last year until insurance denied coverage for this and Xultophy  Check your sugars at least daily in the mornings  Work on lifestyle changes  Consider baseline stress test with cariology.  He will let me know if agreeable  Cholesterol  C/t aspirin, increase Lipitor to 20mg  daily  Blood pressure  Continue Lisinopril HCT 20/12.5mg  daily in the morning   Zakhi was seen today for cpe.  Diagnoses and all orders for this visit:  Routine general medical examination at a health care facility -     POCT Urinalysis DIP (Proadvantage Device) -     Comprehensive metabolic panel -     CBC with Differential/Platelet -     HM DIABETES EYE EXAM -     HM DIABETES FOOT EXAM -     Microalbumin / creatinine urine ratio -     PSA  Need for influenza vaccination -     Flu Vaccine QUAD 6+ mos PF IM (Fluarix Quad PF)  Vaccine counseling  Essential hypertension  Diabetes mellitus with complication (HCC) -     HM DIABETES EYE EXAM -     HM DIABETES FOOT EXAM -     Microalbumin / creatinine urine ratio  Mixed dyslipidemia  Microalbuminuria  Smoker  Screening for prostate cancer -     PSA  Other orders -     atorvastatin  (LIPITOR) 20 MG tablet; Take 1 tablet (20 mg total) by mouth daily. -     aspirin (EQ ASPIRIN ADULT LOW DOSE) 81 MG EC tablet; Take 1 tablet (81 mg total) by mouth daily. Swallow whole. -     Empagliflozin-metFORMIN HCl ER (SYNJARDY XR) 25-1000 MG TB24; Take 1 tablet by mouth daily. -     omeprazole (PRILOSEC) 20 MG capsule; Take 1 capsule (20 mg total)  by mouth daily as needed. Reported on 01/12/2016 -     lisinopril-hydrochlorothiazide (PRINZIDE,ZESTORETIC) 20-12.5 MG tablet; Take 1 tablet by mouth daily. -     Insulin Glargine-Lixisenatide (SOLIQUA) 100-33 UNT-MCG/ML SOPN; Inject 15 Units into the skin daily. -     Insulin Pen Needle (BD PEN NEEDLE NANO U/F) 32G X 4 MM MISC; 1 each by Does not apply route at bedtime.    Follow-up pending labs, yearly for physical

## 2018-03-27 NOTE — Patient Instructions (Signed)
Recommendations: Shingles vaccine:  I recommend you have a shingles vaccine to help prevent shingles or herpes zoster outbreak.   Please call your insurer to inquire about coverage for the Shingrix vaccine given in 2 doses.   Some insurers cover this vaccine after age 54, some cover this after age 47.  If your insurer covers this, then call to schedule appointment to have this vaccine here.  STOP Bydureon weekly injection  Lets change to sample of Soliqua combo injection that contains GLP medication as well as once daily long acting insulin.     Begin Soliqua 15u nightly, continue Synjardy but I increased the dose today   We will call with lab results  I recommend exercising most days of the week using a type of exercise that they would enjoy and stick to such as walking, running, swimming, hiking, biking, aerobics, etc.  I recommend a healthy diet.    Do's:   whole grains such as whole grain pasta, rice, whole grains breads and whole grain cereals.  Use small quantities such as 1/2 cup per serving or 2 slices of bread per serving.    Eat 3-5 fruits daily  Eat beans at least once daily  Eat almonds in small quantities at least 3 days per week    If they eat meat, I recommend small portions of lean meats such as chicken, fish, and [place].  Eat as much NON corn and NON potato vegetables as they like, particularly raw or steamed  Drink several large glasses of water daily  Cautions:  Limit red meat  Limit corn and potatoes  Limit sweets, cake, pie, candy  Limit beer and alcohol  Avoid fried food, fast food, large portions  Avoid sugary drinks such as regular soda and sweet tea

## 2018-03-28 LAB — CBC WITH DIFFERENTIAL/PLATELET
BASOS: 1 %
Basophils Absolute: 0 10*3/uL (ref 0.0–0.2)
EOS (ABSOLUTE): 0.1 10*3/uL (ref 0.0–0.4)
EOS: 1 %
HEMATOCRIT: 43.5 % (ref 37.5–51.0)
HEMOGLOBIN: 14.9 g/dL (ref 13.0–17.7)
IMMATURE GRANS (ABS): 0 10*3/uL (ref 0.0–0.1)
IMMATURE GRANULOCYTES: 0 %
LYMPHS ABS: 2.7 10*3/uL (ref 0.7–3.1)
Lymphs: 37 %
MCH: 28.1 pg (ref 26.6–33.0)
MCHC: 34.3 g/dL (ref 31.5–35.7)
MCV: 82 fL (ref 79–97)
MONOS ABS: 0.7 10*3/uL (ref 0.1–0.9)
Monocytes: 10 %
NEUTROS ABS: 3.8 10*3/uL (ref 1.4–7.0)
NEUTROS PCT: 51 %
Platelets: 245 10*3/uL (ref 150–450)
RBC: 5.3 x10E6/uL (ref 4.14–5.80)
RDW: 14.7 % (ref 12.3–15.4)
WBC: 7.3 10*3/uL (ref 3.4–10.8)

## 2018-03-28 LAB — COMPREHENSIVE METABOLIC PANEL
A/G RATIO: 1.5 (ref 1.2–2.2)
ALBUMIN: 4.5 g/dL (ref 3.5–5.5)
ALT: 28 IU/L (ref 0–44)
AST: 23 IU/L (ref 0–40)
Alkaline Phosphatase: 86 IU/L (ref 39–117)
BUN / CREAT RATIO: 13 (ref 9–20)
BUN: 15 mg/dL (ref 6–24)
Bilirubin Total: 0.4 mg/dL (ref 0.0–1.2)
CALCIUM: 9.8 mg/dL (ref 8.7–10.2)
CO2: 26 mmol/L (ref 20–29)
CREATININE: 1.12 mg/dL (ref 0.76–1.27)
Chloride: 100 mmol/L (ref 96–106)
GFR, EST AFRICAN AMERICAN: 86 mL/min/{1.73_m2} (ref >59)
GFR, EST NON AFRICAN AMERICAN: 74 mL/min/{1.73_m2} (ref >59)
GLOBULIN, TOTAL: 3 g/dL (ref 1.5–4.5)
Glucose: 79 mg/dL (ref 65–99)
POTASSIUM: 3.8 mmol/L (ref 3.5–5.2)
SODIUM: 138 mmol/L (ref 134–144)
Total Protein: 7.5 g/dL (ref 6.0–8.5)

## 2018-03-28 LAB — PSA: Prostate Specific Ag, Serum: 1.1 ng/mL (ref 0.0–4.0)

## 2018-03-28 LAB — MICROALBUMIN / CREATININE URINE RATIO
Creatinine, Urine: 130.3 mg/dL
MICROALB/CREAT RATIO: 2.6 mg/g{creat} (ref 0.0–30.0)
MICROALBUM., U, RANDOM: 3.4 ug/mL

## 2018-03-31 ENCOUNTER — Telehealth: Payer: Self-pay

## 2018-03-31 ENCOUNTER — Other Ambulatory Visit: Payer: Self-pay

## 2018-03-31 DIAGNOSIS — I10 Essential (primary) hypertension: Secondary | ICD-10-CM

## 2018-03-31 DIAGNOSIS — E785 Hyperlipidemia, unspecified: Secondary | ICD-10-CM

## 2018-03-31 NOTE — Telephone Encounter (Signed)
Pt stated his insurance will cover his shingles 100%. Stress test will depend upon meeting his deductible if it has to be done at the hospital. This is an [place] message.

## 2018-03-31 NOTE — Telephone Encounter (Signed)
See if agreeable to cardiology referral and get him on schedule for shingrix.

## 2018-04-10 ENCOUNTER — Telehealth: Payer: Self-pay

## 2018-04-10 NOTE — Telephone Encounter (Signed)
Patient notified of appointment.  

## 2018-04-10 NOTE — Telephone Encounter (Signed)
Let message on voicemail for patient to call back for Cardiology appointment on 04-14-18 at [facility] on [facility] location.

## 2018-04-14 ENCOUNTER — Encounter: Payer: Self-pay | Admitting: Cardiology

## 2018-04-14 ENCOUNTER — Ambulatory Visit (INDEPENDENT_AMBULATORY_CARE_PROVIDER_SITE_OTHER): Payer: PRIVATE HEALTH INSURANCE | Admitting: Cardiology

## 2018-04-14 ENCOUNTER — Telehealth: Payer: Self-pay | Admitting: Cardiology

## 2018-04-14 VITALS — BP 100/72 | HR 76 | Ht 70.0 in | Wt 213.8 lb

## 2018-04-14 DIAGNOSIS — Z7189 Other specified counseling: Secondary | ICD-10-CM | POA: Diagnosis not present

## 2018-04-14 DIAGNOSIS — E782 Mixed hyperlipidemia: Secondary | ICD-10-CM

## 2018-04-14 DIAGNOSIS — I10 Essential (primary) hypertension: Secondary | ICD-10-CM

## 2018-04-14 DIAGNOSIS — E118 Type 2 diabetes mellitus with unspecified complications: Secondary | ICD-10-CM | POA: Diagnosis not present

## 2018-04-14 DIAGNOSIS — F17200 Nicotine dependence, unspecified, uncomplicated: Secondary | ICD-10-CM

## 2018-04-14 NOTE — Progress Notes (Signed)
Cardiology Office Note    Date:  04/14/2018   ID:  Jeffrey Rodriguez, DOB 06-Apr-1966, MRN [id number]  PCP:  Carlena Hurl, PA-C  Cardiologist:  Fransico Him, MD   No chief complaint on file.   History of Present Illness:  Jeffrey Rodriguez is a 52 y.o. male who is being seen today for the evaluation of cardiac risk factors for CAD at the request of Carlena Hurl, PA-C.  This is a very pleasant 52 year old male with a history of type 2 diabetes mellitus, GERD, hypertension, hyperlipidemia and tobacco abuse who was recently seen by his PCP for routine physical exam.  He was completely asymptomatic but because of cardiac risk factors he is referred for assessment for possible underlying CAD.    He denies any chest pain or pressure, SOB, DOE, PND, orthopnea, LE edema, dizziness, palpitations or syncope. He is compliant with his meds and is tolerating meds with no SE.      Past Medical History:  Diagnosis Date  . Diabetes mellitus without complication ([place]) 9509  . GERD (gastroesophageal reflux disease)   . Hyperlipidemia   . Hypertension 2010  . Microalbuminuria 11/2013  . Mixed dyslipidemia    diet and exercise  . Obesity   . Smoker    .5 ppd  . Wears glasses     Past Surgical History:  Procedure Laterality Date  . COLONOSCOPY  12/2015   tubular adenoma, Dr. Wilfrid Lund  . WISDOM TOOTH EXTRACTION      Current Medications: No outpatient medications have been marked as taking for the 04/14/18 encounter (Appointment) with Sueanne Margarita, MD.   Current Facility-Administered Medications for the 04/14/18 encounter (Appointment) with Sueanne Margarita, MD  Medication  . triamcinolone acetonide (KENALOG-40) injection 40 mg    Allergies:   Patient has no known allergies.   Social History   Socioeconomic History  . Marital status: Married    Spouse name: Not on file  . Number of children: Not on file  . Years of education: Not on file  . Highest education level: Not on file   Occupational History  . Not on file  Social Needs  . Financial resource strain: Not on file  . Food insecurity:    Worry: Not on file    Inability: Not on file  . Transportation needs:    Medical: Not on file    Non-medical: Not on file  Tobacco Use  . Smoking status: Current Every Day Smoker    Packs/day: 0.50    Years: 26.00    Pack years: 13.00    Types: Cigarettes  . Smokeless tobacco: Never Used  Substance and Sexual Activity  . Alcohol use: No    Alcohol/week: 0.0 standard drinks  . Drug use: No  . Sexual activity: Not on file  Lifestyle  . Physical activity:    Days per week: Not on file    Minutes per session: Not on file  . Stress: Not on file  Relationships  . Social connections:    Talks on phone: Not on file    Gets together: Not on file    Attends religious service: Not on file    Active member of club or organization: Not on file    Attends meetings of clubs or organizations: Not on file    Relationship status: Not on file  Other Topics Concern  . Not on file  Social History Narrative   Lives with wife, no children, works for paper  company, exercise - walking.   03/2018     Family History:  The patient's family history includes Arthritis in his mother; Depression in his brother; Hypertension in his other; Pneumonia in his father.   ROS:   Please see the history of present illness.    ROS All other systems reviewed and are negative.  No flowsheet data found.     PHYSICAL EXAM:   VS:  There were no vitals taken for this visit.   GEN: Well nourished, well developed, in no acute distress  HEENT: normal  Neck: no JVD, carotid bruits, or masses Cardiac: RRR; no murmurs, rubs, or gallops,no edema.  Intact distal pulses bilaterally.  Respiratory:  clear to auscultation bilaterally, normal work of breathing GI: soft, nontender, nondistended, + BS MS: no deformity or atrophy  Skin: warm and dry, no rash Neuro:  Alert and Oriented x 3, Strength and  sensation are intact Psych: euthymic mood, full affect  Wt Readings from Last 3 Encounters:  03/27/18 207 lb 9.6 oz (94.2 kg)  12/11/17 212 lb (96.2 kg)  08/01/17 219 lb 9.6 oz (99.6 kg)      Studies/Labs Reviewed:   EKG:  EKG is ordered today.  The ekg ordered today demonstrates normal sinus rhythm at 76 bpm with no ST changes.  Recent Labs: 03/27/2018: ALT 28; BUN 15; Creatinine, Ser 1.12; Hemoglobin 14.9; Platelets 245; Potassium 3.8; Sodium 138   Lipid Panel    Component Value Date/Time   CHOL 133 02/19/2017 0832   TRIG 137 02/19/2017 0832   HDL 38 (L) 02/19/2017 0832   CHOLHDL 3.5 02/19/2017 0832   VLDL 27 02/19/2017 0832   LDLCALC 68 02/19/2017 0832    Additional studies/ records that were reviewed today include:  Office notes from PA    ASSESSMENT:    1. Encounter for cardiac risk counseling   2. Diabetes mellitus with complication ([place])   3. Essential hypertension   4. Mixed dyslipidemia   5. Smoker      PLAN:  In order of problems listed above:  1.  Cardiac Risk assessment - he has multiple cardiac risk factors for development of underlying CAD including diabetes mellitus, hypertension, hyperlipidemia and ongoing tobacco abuse.  EKG is nonischemic and he is asymptomatic.  I recommend a baseline exercise treadmill test to rule out inducible ischemia as well and has chest CT for calcium score.  This will help  assess future risk.  2.  Type 2 diabetes mellitus -treatment per PCP  3.  Hypertension - BP is well controlled on exam today.  He will continue on lisinopril HCT 20-12.5 mg daily.  4.  Hyperlipidemia -he will continue on atorvastatin 20 mg daily.  His LDL goal is less than 70 given his underlying diabetes.  5.  Tobacco abuse -we discussed the need to try to get off tobacco use completely given his cardiac risk factors.   Medication Adjustments/Labs and Tests Ordered: Current medicines are reviewed at length with the patient today.  Concerns  regarding medicines are outlined above.  Medication changes, Labs and Tests ordered today are listed in the Patient Instructions below.  There are no Patient Instructions on file for this visit.   Signed, Fransico Him, MD  04/14/2018 8:39 AM    [address], [place], [place]  [address] Phone: [phone]; Fax: [phone]

## 2018-04-14 NOTE — Telephone Encounter (Signed)
New message   Patient would like a call back about the calcium score test, he said he was not told that there will be a charge of $150, he can not pay that.

## 2018-04-14 NOTE — Patient Instructions (Signed)
Medication Instructions:  Your physician recommends that you continue on your current medications as directed. Please refer to the Current Medication list given to you today.  Testing/Procedures: Your physician has requested that you have an exercise tolerance test. For further information please visit [email or web address]. Please also follow instruction sheet, as given.  Schedule a Calcium Score   Follow-Up: As needed  If you need a refill on your cardiac medications before your next appointment, please call your pharmacy.

## 2018-04-14 NOTE — Telephone Encounter (Signed)
Spoke with Dr. Radford Pax, she advised it was fine to wait on the calcium score. The patient expressed understanding and had no further questions.

## 2018-04-26 ENCOUNTER — Telehealth: Payer: Self-pay | Admitting: Medical

## 2018-04-26 NOTE — Telephone Encounter (Signed)
P.A. Benita Stabile

## 2018-04-29 ENCOUNTER — Telehealth: Payer: Self-pay | Admitting: Medical

## 2018-04-29 ENCOUNTER — Other Ambulatory Visit: Payer: Self-pay | Admitting: Medical

## 2018-04-29 MED ORDER — NAPROXEN 500 MG PO TABS: 500.0000 mg | ORAL_TABLET | Freq: Two times a day (BID) | ORAL | 0 refills | Status: DC

## 2018-04-29 NOTE — Telephone Encounter (Signed)
Patent notified !

## 2018-04-29 NOTE — Telephone Encounter (Signed)
Pt requesting refill on Naproxen for tailbone pain

## 2018-04-29 NOTE — Telephone Encounter (Signed)
please have him consider returning to discuss as I cannot see where we have talked about tailbone pain recently.  I did send a refill but if he is still having tailbone pain specifically without injury he may need a scan

## 2018-05-05 ENCOUNTER — Telehealth: Payer: Self-pay | Admitting: Medical

## 2018-05-05 NOTE — Telephone Encounter (Signed)
Recv'd fax that Bydureon requires P.A. But this was already approved til 1/20.  Called ins company t# [phone] and was error that they fixed and called pharmacy back and went thru and with discount card went thru for $0.   Called pt and he states that [place] switched him to [place].  He states if he has any issues getting it he will call me

## 2018-05-08 ENCOUNTER — Ambulatory Visit (INDEPENDENT_AMBULATORY_CARE_PROVIDER_SITE_OTHER): Payer: PRIVATE HEALTH INSURANCE

## 2018-05-08 DIAGNOSIS — E118 Type 2 diabetes mellitus with unspecified complications: Secondary | ICD-10-CM | POA: Diagnosis not present

## 2018-05-08 DIAGNOSIS — Z7189 Other specified counseling: Secondary | ICD-10-CM | POA: Diagnosis not present

## 2018-05-08 LAB — EXERCISE TOLERANCE TEST
CHL RATE OF PERCEIVED EXERTION: 16
CSEPED: 8 min
CSEPEDS: 0 s
Estimated workload: 10 METS
MPHR: 166 {beats}/min
Peak HR: 151 {beats}/min
Percent HR: 90 %
Rest HR: 80 {beats}/min

## 2018-05-22 ENCOUNTER — Encounter (INDEPENDENT_AMBULATORY_CARE_PROVIDER_SITE_OTHER): Payer: PRIVATE HEALTH INSURANCE | Admitting: Ophthalmology

## 2018-05-22 DIAGNOSIS — H353211 Exudative age-related macular degeneration, right eye, with active choroidal neovascularization: Secondary | ICD-10-CM

## 2018-05-22 DIAGNOSIS — I10 Essential (primary) hypertension: Secondary | ICD-10-CM

## 2018-05-22 DIAGNOSIS — H43813 Vitreous degeneration, bilateral: Secondary | ICD-10-CM

## 2018-05-22 DIAGNOSIS — H353122 Nonexudative age-related macular degeneration, left eye, intermediate dry stage: Secondary | ICD-10-CM

## 2018-05-22 DIAGNOSIS — H2511 Age-related nuclear cataract, right eye: Secondary | ICD-10-CM

## 2018-05-22 DIAGNOSIS — H35033 Hypertensive retinopathy, bilateral: Secondary | ICD-10-CM | POA: Diagnosis not present

## 2018-05-22 DIAGNOSIS — H34831 Tributary (branch) retinal vein occlusion, right eye, with macular edema: Secondary | ICD-10-CM | POA: Diagnosis not present

## 2018-06-19 ENCOUNTER — Encounter (INDEPENDENT_AMBULATORY_CARE_PROVIDER_SITE_OTHER): Payer: PRIVATE HEALTH INSURANCE | Admitting: Ophthalmology

## 2018-06-19 DIAGNOSIS — H35033 Hypertensive retinopathy, bilateral: Secondary | ICD-10-CM

## 2018-06-19 DIAGNOSIS — H34831 Tributary (branch) retinal vein occlusion, right eye, with macular edema: Secondary | ICD-10-CM | POA: Diagnosis not present

## 2018-06-19 DIAGNOSIS — I10 Essential (primary) hypertension: Secondary | ICD-10-CM | POA: Diagnosis not present

## 2018-06-19 DIAGNOSIS — H353211 Exudative age-related macular degeneration, right eye, with active choroidal neovascularization: Secondary | ICD-10-CM | POA: Diagnosis not present

## 2018-06-19 DIAGNOSIS — H43813 Vitreous degeneration, bilateral: Secondary | ICD-10-CM | POA: Diagnosis not present

## 2018-07-12 ENCOUNTER — Telehealth: Payer: Self-pay | Admitting: Medical

## 2018-07-12 NOTE — Telephone Encounter (Signed)
Recv'd renewal for Bydureon but pt no longer on this, has been changed to [employer]

## 2018-07-12 NOTE — Telephone Encounter (Signed)
P.A. SYNJARDY 

## 2018-07-17 ENCOUNTER — Encounter (INDEPENDENT_AMBULATORY_CARE_PROVIDER_SITE_OTHER): Payer: PRIVATE HEALTH INSURANCE | Admitting: Ophthalmology

## 2018-07-26 NOTE — Telephone Encounter (Signed)
P.A. approved til 07/13/19, pt informed,

## 2018-07-31 ENCOUNTER — Encounter (INDEPENDENT_AMBULATORY_CARE_PROVIDER_SITE_OTHER): Payer: PRIVATE HEALTH INSURANCE | Admitting: Ophthalmology

## 2018-07-31 DIAGNOSIS — I10 Essential (primary) hypertension: Secondary | ICD-10-CM

## 2018-07-31 DIAGNOSIS — H353122 Nonexudative age-related macular degeneration, left eye, intermediate dry stage: Secondary | ICD-10-CM

## 2018-07-31 DIAGNOSIS — H353211 Exudative age-related macular degeneration, right eye, with active choroidal neovascularization: Secondary | ICD-10-CM

## 2018-07-31 DIAGNOSIS — H34831 Tributary (branch) retinal vein occlusion, right eye, with macular edema: Secondary | ICD-10-CM | POA: Diagnosis not present

## 2018-07-31 DIAGNOSIS — H43813 Vitreous degeneration, bilateral: Secondary | ICD-10-CM

## 2018-07-31 DIAGNOSIS — H2513 Age-related nuclear cataract, bilateral: Secondary | ICD-10-CM

## 2018-07-31 DIAGNOSIS — H35033 Hypertensive retinopathy, bilateral: Secondary | ICD-10-CM | POA: Diagnosis not present

## 2018-09-11 ENCOUNTER — Encounter (INDEPENDENT_AMBULATORY_CARE_PROVIDER_SITE_OTHER): Payer: PRIVATE HEALTH INSURANCE | Admitting: Ophthalmology

## 2018-09-11 DIAGNOSIS — I10 Essential (primary) hypertension: Secondary | ICD-10-CM

## 2018-09-11 DIAGNOSIS — H43813 Vitreous degeneration, bilateral: Secondary | ICD-10-CM

## 2018-09-11 DIAGNOSIS — H34831 Tributary (branch) retinal vein occlusion, right eye, with macular edema: Secondary | ICD-10-CM | POA: Diagnosis not present

## 2018-09-11 DIAGNOSIS — H353211 Exudative age-related macular degeneration, right eye, with active choroidal neovascularization: Secondary | ICD-10-CM | POA: Diagnosis not present

## 2018-09-11 DIAGNOSIS — H35033 Hypertensive retinopathy, bilateral: Secondary | ICD-10-CM | POA: Diagnosis not present

## 2018-09-11 DIAGNOSIS — H353122 Nonexudative age-related macular degeneration, left eye, intermediate dry stage: Secondary | ICD-10-CM

## 2018-09-11 DIAGNOSIS — H2513 Age-related nuclear cataract, bilateral: Secondary | ICD-10-CM

## 2018-10-21 ENCOUNTER — Telehealth: Payer: Self-pay | Admitting: Medical

## 2018-10-21 NOTE — Telephone Encounter (Signed)
Needs virtual diabetes med check

## 2018-10-23 NOTE — Telephone Encounter (Signed)
Left message for pt

## 2018-11-06 ENCOUNTER — Encounter (INDEPENDENT_AMBULATORY_CARE_PROVIDER_SITE_OTHER): Payer: PRIVATE HEALTH INSURANCE | Admitting: Ophthalmology

## 2018-11-06 ENCOUNTER — Other Ambulatory Visit: Payer: Self-pay

## 2018-11-06 ENCOUNTER — Encounter: Payer: Self-pay | Admitting: Medical

## 2018-11-06 DIAGNOSIS — H35033 Hypertensive retinopathy, bilateral: Secondary | ICD-10-CM

## 2018-11-06 DIAGNOSIS — H34831 Tributary (branch) retinal vein occlusion, right eye, with macular edema: Secondary | ICD-10-CM | POA: Diagnosis not present

## 2018-11-06 DIAGNOSIS — H353122 Nonexudative age-related macular degeneration, left eye, intermediate dry stage: Secondary | ICD-10-CM

## 2018-11-06 DIAGNOSIS — I10 Essential (primary) hypertension: Secondary | ICD-10-CM | POA: Diagnosis not present

## 2018-11-06 DIAGNOSIS — H353211 Exudative age-related macular degeneration, right eye, with active choroidal neovascularization: Secondary | ICD-10-CM | POA: Diagnosis not present

## 2018-11-06 DIAGNOSIS — H43813 Vitreous degeneration, bilateral: Secondary | ICD-10-CM

## 2018-11-06 DIAGNOSIS — H2511 Age-related nuclear cataract, right eye: Secondary | ICD-10-CM

## 2018-12-11 ENCOUNTER — Ambulatory Visit (INDEPENDENT_AMBULATORY_CARE_PROVIDER_SITE_OTHER): Payer: PRIVATE HEALTH INSURANCE | Admitting: Medical

## 2018-12-11 ENCOUNTER — Encounter: Payer: Self-pay | Admitting: Medical

## 2018-12-11 ENCOUNTER — Other Ambulatory Visit: Payer: Self-pay

## 2018-12-11 VITALS — BP 120/80 | HR 94 | Temp 97.9°F | Resp 16 | Ht 69.0 in | Wt 209.0 lb

## 2018-12-11 DIAGNOSIS — E118 Type 2 diabetes mellitus with unspecified complications: Secondary | ICD-10-CM | POA: Diagnosis not present

## 2018-12-11 DIAGNOSIS — R809 Proteinuria, unspecified: Secondary | ICD-10-CM | POA: Diagnosis not present

## 2018-12-11 DIAGNOSIS — E782 Mixed hyperlipidemia: Secondary | ICD-10-CM | POA: Diagnosis not present

## 2018-12-11 DIAGNOSIS — I10 Essential (primary) hypertension: Secondary | ICD-10-CM

## 2018-12-11 DIAGNOSIS — F17200 Nicotine dependence, unspecified, uncomplicated: Secondary | ICD-10-CM

## 2018-12-11 MED ORDER — INSULIN GLARGINE-LIXISENATIDE 100-33 UNT-MCG/ML ~~LOC~~ SOPN: 15.0000 [IU] | PEN_INJECTOR | Freq: Every day | SUBCUTANEOUS | 11 refills | Status: DC

## 2018-12-11 NOTE — Progress Notes (Signed)
Subjective:     Patient ID: Jeffrey Rodriguez, male   DOB: 11/28/1965, 53 y.o.   MRN: [id number]  This visit type was conducted due to national recommendations for restrictions regarding the COVID-19 Pandemic (e.g. social distancing) in an effort to limit this patient's exposure and mitigate transmission in our community.  Due to their co-morbid illnesses, this patient is at least at moderate risk for complications without adequate follow up.  This format is felt to be most appropriate for this patient at this time.    Documentation for virtual audio and video telecommunications through Zoom encounter:  The patient was located at home. The provider was located in the office. The patient did consent to this visit and is aware of possible charges through their insurance for this visit.  The other persons participating in this telemedicine service were none. Time spent on call was 20 minutes and in review of previous records >25 minutes total.  This virtual service is not related to other E/M service within previous 7 days.   HPI Chief Complaint  Patient presents with  . med check    med check  sugars running around 110 to 120     Medical team: Sees an eye doctor and dentist Cardiology, Dr. Fransico Him GI, Dr. Wilfrid Lund Jeffrey Rodriguez, Camelia Eng, PA-C here for primary care   Virtual visit today for med check.  He has a history of diabetes, hyperlipidemia, hypertension, micro albumin, obesity.   Diabetes-compliant with empagliflozin metformin XR 25/1000 mg daily, Soliqua pen 15 units daily.  Glucose running 112-120.   No polyuria, no polydipsia, no blurred vision.   Exercise - some, walking.   Plays with grand kids.     Hyperlipidemia-compliant with Lipitor 20 mg and aspirin 81 mg daily  HTN - compliant with Lisinopril HCT 20/12.5mg  daily  No other c/o  Past Medical History:  Diagnosis Date  . Diabetes mellitus without complication ([place]) 0932  . GERD (gastroesophageal reflux  disease)   . Hyperlipidemia   . Hypertension 2010  . Microalbuminuria 11/2013  . Mixed dyslipidemia    diet and exercise  . Obesity   . Smoker    0.5ppd  . Wears glasses    Current Outpatient Medications on File Prior to Visit  Medication Sig Dispense Refill  . aspirin (EQ ASPIRIN ADULT LOW DOSE) 81 MG EC tablet Take 1 tablet (81 mg total) by mouth daily. Swallow whole. 90 tablet 3  . atorvastatin (LIPITOR) 20 MG tablet Take 1 tablet (20 mg total) by mouth daily. 90 tablet 3  . Empagliflozin-metFORMIN HCl ER (SYNJARDY XR) 25-1000 MG TB24 Take 1 tablet by mouth daily. 90 tablet 3  . ibuprofen (ADVIL,MOTRIN) 800 MG tablet Take 1 tablet (800 mg total) by mouth 3 (three) times daily. Take with food. 30 tablet 0  . Insulin Glargine-Lixisenatide (SOLIQUA) 100-33 UNT-MCG/ML SOPN Inject 15 Units into the skin daily. 3 mL 5  . Insulin Pen Needle (BD PEN NEEDLE NANO U/F) 32G X 4 MM MISC 1 each by Does not apply route at bedtime. 100 each 3  . Lancet Devices (ACCU-CHEK SOFTCLIX) lancets Use as instructed 1 each 3  . lisinopril-hydrochlorothiazide (PRINZIDE,ZESTORETIC) 20-12.5 MG tablet Take 1 tablet by mouth daily. 90 tablet 3  . omeprazole (PRILOSEC) 20 MG capsule Take 1 capsule (20 mg total) by mouth daily as needed. Reported on 01/12/2016 90 capsule 3  . naproxen (NAPROSYN) 500 MG tablet Take 1 tablet (500 mg total) by mouth 2 (two) times daily with a  meal. (Patient not taking: Reported on 12/11/2018) 30 tablet 0   Current Facility-Administered Medications on File Prior to Visit  Medication Dose Route Frequency Provider Last Rate Last Dose  . triamcinolone acetonide (KENALOG-40) injection 40 mg  40 mg Intramuscular Once Denita Lung, MD         Review of Systems As in subjective    Objective:   Physical Exam  Ht 5\' 9"  (1.753 m)   Wt 213 lb (96.6 kg)   BMI 31.45 kg/m   Due to coronavirus pandemic stay at home measures, patient visit was virtual and they were not examined in person.    Gen: nad, answers questions appropriately      Assessment:     Encounter Diagnoses  Name Primary?  . Diabetes mellitus with complication ([place]) Yes  . Essential hypertension   . Mixed dyslipidemia   . Microalbuminuria   . Smoker        Plan:     We discussed his medications, chronic medical issues.  He is compliant with medications.  He seems to be doing well with no complaints.  Reminded him of yearly eye doctor visit, yearly dental visits, daily foot checks, regular glucometer testing.  Counseled on diet and exercise and tobacco cessation.  He will return here in 1 week for fasting labs to check his hemoglobin A1c and lipid.  I reviewed labs from October 2019.  I sent him to cardiology in October 2019 for baseline stress test.  I reviewed those results from May 08, 2018 that showed exercise treadmill test with normal exercise tolerance, no chest pain, normal blood pressure response, no ST changes, negative adequate exercise tolerance test.  [name] was seen today for med check.  Diagnoses and all orders for this visit:  Diabetes mellitus with complication ([place]) -     Hemoglobin A1c; Future  Essential hypertension  Mixed dyslipidemia -     Lipid panel; Future  Microalbuminuria  Smoker  Other orders -     Insulin Glargine-Lixisenatide (SOLIQUA) 100-33 UNT-MCG/ML SOPN; Inject 15 Units into the skin daily.

## 2018-12-18 ENCOUNTER — Other Ambulatory Visit: Payer: PRIVATE HEALTH INSURANCE

## 2018-12-18 ENCOUNTER — Other Ambulatory Visit: Payer: Self-pay

## 2018-12-18 DIAGNOSIS — E118 Type 2 diabetes mellitus with unspecified complications: Secondary | ICD-10-CM

## 2018-12-18 DIAGNOSIS — E782 Mixed hyperlipidemia: Secondary | ICD-10-CM

## 2018-12-18 LAB — LIPID PANEL

## 2018-12-19 ENCOUNTER — Other Ambulatory Visit: Payer: Self-pay | Admitting: Medical

## 2018-12-19 LAB — HEMOGLOBIN A1C
Est. average glucose Bld gHb Est-mCnc: 214 mg/dL
Hgb A1c MFr Bld: 9.1 % — ABNORMAL HIGH (ref 4.8–5.6)

## 2018-12-19 LAB — LIPID PANEL
Chol/HDL Ratio: 3.5 ratio (ref 0.0–5.0)
Cholesterol, Total: 113 mg/dL (ref 100–199)
HDL: 32 mg/dL — ABNORMAL LOW (ref >39)
LDL Calculated: 39 mg/dL (ref 0–99)
Triglycerides: 209 mg/dL — ABNORMAL HIGH (ref 0–149)
VLDL Cholesterol Cal: 42 mg/dL — ABNORMAL HIGH (ref 5–40)

## 2018-12-19 MED ORDER — INSULIN GLARGINE-LIXISENATIDE 100-33 UNT-MCG/ML ~~LOC~~ SOPN: 25.0000 [IU] | PEN_INJECTOR | Freq: Every day | SUBCUTANEOUS | 5 refills | Status: DC

## 2019-01-15 ENCOUNTER — Encounter (INDEPENDENT_AMBULATORY_CARE_PROVIDER_SITE_OTHER): Payer: PRIVATE HEALTH INSURANCE | Admitting: Ophthalmology

## 2019-01-15 ENCOUNTER — Other Ambulatory Visit: Payer: Self-pay

## 2019-01-15 DIAGNOSIS — H34831 Tributary (branch) retinal vein occlusion, right eye, with macular edema: Secondary | ICD-10-CM | POA: Diagnosis not present

## 2019-01-15 DIAGNOSIS — H353132 Nonexudative age-related macular degeneration, bilateral, intermediate dry stage: Secondary | ICD-10-CM | POA: Diagnosis not present

## 2019-01-15 DIAGNOSIS — I10 Essential (primary) hypertension: Secondary | ICD-10-CM

## 2019-01-15 DIAGNOSIS — H35033 Hypertensive retinopathy, bilateral: Secondary | ICD-10-CM

## 2019-01-15 DIAGNOSIS — H43813 Vitreous degeneration, bilateral: Secondary | ICD-10-CM

## 2019-01-27 ENCOUNTER — Telehealth: Payer: Self-pay | Admitting: Medical

## 2019-01-27 NOTE — Telephone Encounter (Signed)
Pt called stating that [place] him that his insurance is no longer covering his solique  Please advise

## 2019-01-28 ENCOUNTER — Other Ambulatory Visit: Payer: Self-pay | Admitting: Medical

## 2019-01-28 MED ORDER — XULTOPHY 100-3.6 UNIT-MG/ML ~~LOC~~ SOPN: 20.0000 [IU] | PEN_INJECTOR | Freq: Every day | SUBCUTANEOUS | 5 refills | Status: DC

## 2019-01-28 NOTE — Telephone Encounter (Signed)
lmom for patient to call the office.

## 2019-01-28 NOTE — Telephone Encounter (Signed)
There is only 1 other medication similar to [name] on the market that is apples to apples comparison called Xultophy.  When he runs out of [place] 20units daily, I would have him switch to Xultophy 20 units daily.   If Xultophy is not covered (I sent it to pharmacy just now), then we will have to try some other strategy.

## 2019-01-29 NOTE — Telephone Encounter (Signed)
LVM explaining the other med and advised him to call office if they will not cover Xultophy. Jeffrey Rodriguez

## 2019-01-30 ENCOUNTER — Encounter: Payer: Self-pay | Admitting: Medical

## 2019-01-30 ENCOUNTER — Ambulatory Visit: Payer: PRIVATE HEALTH INSURANCE | Admitting: Medical

## 2019-01-30 ENCOUNTER — Other Ambulatory Visit: Payer: Self-pay

## 2019-01-30 VITALS — BP 110/86 | HR 90 | Temp 98.6°F | Ht 72.0 in | Wt 212.2 lb

## 2019-01-30 DIAGNOSIS — M79604 Pain in right leg: Secondary | ICD-10-CM | POA: Diagnosis not present

## 2019-01-30 DIAGNOSIS — M7918 Myalgia, other site: Secondary | ICD-10-CM | POA: Diagnosis not present

## 2019-01-30 DIAGNOSIS — M5431 Sciatica, right side: Secondary | ICD-10-CM

## 2019-01-30 MED ORDER — NAPROXEN 500 MG PO TABS: 500.0000 mg | ORAL_TABLET | Freq: Two times a day (BID) | ORAL | 0 refills | Status: DC

## 2019-01-30 MED ORDER — HYDROCODONE-ACETAMINOPHEN 5-325 MG PO TABS: 1.0000 | ORAL_TABLET | Freq: Four times a day (QID) | ORAL | 0 refills | Status: DC | PRN

## 2019-01-30 NOTE — Progress Notes (Signed)
Subjective: Chief Complaint  Patient presents with  . Other    buttock pain on right side x3 days    Here for buttock pain on right side x 3 days.   No recent activity to flare it up, no trauma, no fall, no injury.   Seems to be getting this periodically a few times per year.  In the past week just been at work, no particular exercise.   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   Not doing any particular stretching.  The paper boxes is about 25lb.   Pain more in the right buttock.   No specific back pain, pain is somewhat in the thigh.    No pain radiating down to lower leg.   No numbness or tingling.  No weakness in the leg.  No redness or swelling.  No fever.    No other aggravating or relieving factors. No other complaint.  Past Medical History:  Diagnosis Date  . Diabetes mellitus without complication ([place]) 3329  . GERD (gastroesophageal reflux disease)   . Hyperlipidemia   . Hypertension 2010  . Microalbuminuria 11/2013  . Mixed dyslipidemia    diet and exercise  . Obesity   . Smoker    0.5ppd  . Wears glasses    Current Outpatient Medications on File Prior to Visit  Medication Sig Dispense Refill  . aspirin (EQ ASPIRIN ADULT LOW DOSE) 81 MG EC tablet Take 1 tablet (81 mg total) by mouth daily. Swallow whole. 90 tablet 3  . atorvastatin (LIPITOR) 20 MG tablet Take 1 tablet (20 mg total) by mouth daily. 90 tablet 3  . Empagliflozin-metFORMIN HCl ER (SYNJARDY XR) 25-1000 MG TB24 Take 1 tablet by mouth daily. 90 tablet 3  . Insulin Degludec-Liraglutide (XULTOPHY) 100-3.6 UNIT-MG/ML SOPN Inject 20 Units into the skin daily. 6 mL 5  . Insulin Pen Needle (BD PEN NEEDLE NANO U/F) 32G X 4 MM MISC 1 each by Does not apply route at bedtime. 100 each 3  . Lancet Devices (ACCU-CHEK SOFTCLIX) lancets Use as instructed 1 each 3  . lisinopril-hydrochlorothiazide (PRINZIDE,ZESTORETIC) 20-12.5 MG tablet Take 1 tablet by mouth daily. 90 tablet 3  . omeprazole (PRILOSEC) 20  MG capsule Take 1 capsule (20 mg total) by mouth daily as needed. Reported on 01/12/2016 90 capsule 3   Current Facility-Administered Medications on File Prior to Visit  Medication Dose Route Frequency Provider Last Rate Last Dose  . triamcinolone acetonide (KENALOG-40) injection 40 mg  40 mg Intramuscular Once Denita Lung, MD       ROS as in subjective   Objective: BP 110/86   Pulse 90   Temp 98.6 F (37 C) (Oral)   Ht 6' (1.829 m)   Wt 212 lb 3.2 oz (96.3 kg)   SpO2 97%   BMI 28.78 kg/m   Gen: wd, wn, nad Skin: unremarkable Back non tender, no deformity, mild pain with extension and flexion although ROM full,  Tender in right buttock, but otherwise legs non tender and normal hip and leg ROM Legs neurovascularly intact No leg edema     Assessment: Encounter Diagnoses  Name Primary?  . Sciatica of right side Yes  . Buttock pain   . Leg pain, right      Plan: Discussed symptoms, findings, and causes of sciatica.   Advised he go for xray.   Discussed recommendations as below.  Patient Instructions  Please go to [facility] for your spine  xray.   Their hours are 8am - 4:30 pm Monday - Friday.  Take your insurance card with you.  [facility] [phone]  [address], [place], [place] [address]  [address], [place] [address]    We discussed possible causes of your sciatica pain.     Specific recommendations to help your current back pain include: Rest your back for the next 5-7 days Avoid strenuous activity or heavy lifting for the next 5-7 days You may use heat to your back such as a hot shower, moist hot towel, or heat pad 2-3 times per day short-term Consider going to a massage therapist  You can use Naprosyn for pain and inflammation 2 times per day for the next 3-5 days then as needed.  (Caution-I do not intend for you to use anti-inflammatories such as ibuprofen, Aleve, or Advil on a regular basis due to risk  of gastric bleeding or damage to the liver or kidney)  You may use Norco/Hydrocodone , 1 tablet twice daily for worse or breakthrough pain as needed for a limited time.  Caution as this medication can cause drowsiness.  This medicine is also a controlled substance and only intended to be used for a limited time.  General recommendations to help prevent back problems and to keep your back strong and healthy: I recommend you get 150 minutes of aerobic exercise per week such as walking, running, swimming, dancing, golf or other exercise. I recommend you do a general stretching routine daily. To prevent back issues, I recommend you use some core strengthening exercises as we discussed.  For example, do the following regimen at least 2 days per week:  perform 2-3 sets of 25 in each of abdominal crunches or sit ups  perform 2-3 sets of 25 in each of core twists  perform 2-3 sets of 25 in each of upright rows  perform 2-3 sets of 25 in each of dead lifts Drink plenty of water throughout the day so that your urine is clear If you smoke, I strongly recommend you quit smoking as this causes inflammation throughout the body including your back  Please note: If you experience worsening or significantly different symptoms in the near future then call or return immediately If you develop fever, urinary changes, bowel changes, body aches and chills, then get reevaluated immediately by either calling, returning, or going to the urgent care or emergency department after hours or weekend If you have severe back pain compared to today's visit, or numbness of the genitalia, incontinence of bowel or bladder, or develop the inability to walk or stand, then go to the emergency department immediately       [facility] was seen today for other.  Diagnoses and all orders for this visit:  Sciatica of right side -     DG Lumbar Spine Complete; Future  Buttock pain  Leg pain, right  Other orders -      naproxen (NAPROSYN) 500 MG tablet; Take 1 tablet (500 mg total) by mouth 2 (two) times daily with a meal. -     HYDROcodone-acetaminophen (NORCO) 5-325 MG tablet; Take 1 tablet by mouth every 6 (six) hours as needed for moderate pain.

## 2019-01-30 NOTE — Patient Instructions (Signed)
Please go to [facility] for your spine xray.   Their hours are 8am - 4:30 pm Monday - Friday.  Take your insurance card with you.  [facility] [phone]  [address], [place], [place] [address]  [address], [place] [address]    We discussed possible causes of your sciatica pain.     Specific recommendations to help your current back pain include: Rest your back for the next 5-7 days Avoid strenuous activity or heavy lifting for the next 5-7 days You may use heat to your back such as a hot shower, moist hot towel, or heat pad 2-3 times per day short-term Consider going to a massage therapist  You can use Naprosyn for pain and inflammation 2 times per day for the next 3-5 days then as needed.  (Caution-I do not intend for you to use anti-inflammatories such as ibuprofen, Aleve, or Advil on a regular basis due to risk of gastric bleeding or damage to the liver or kidney)  You may use Norco/Hydrocodone , 1 tablet twice daily for worse or breakthrough pain as needed for a limited time.  Caution as this medication can cause drowsiness.  This medicine is also a controlled substance and only intended to be used for a limited time.  General recommendations to help prevent back problems and to keep your back strong and healthy: I recommend you get 150 minutes of aerobic exercise per week such as walking, running, swimming, dancing, golf or other exercise. I recommend you do a general stretching routine daily. To prevent back issues, I recommend you use some core strengthening exercises as we discussed.  For example, do the following regimen at least 2 days per week:  perform 2-3 sets of 25 in each of abdominal crunches or sit ups  perform 2-3 sets of 25 in each of core twists  perform 2-3 sets of 25 in each of upright rows  perform 2-3 sets of 25 in each of dead lifts Drink plenty of water throughout the day so that your urine is clear If you  smoke, I strongly recommend you quit smoking as this causes inflammation throughout the body including your back  Please note: If you experience worsening or significantly different symptoms in the near future then call or return immediately If you develop fever, urinary changes, bowel changes, body aches and chills, then get reevaluated immediately by either calling, returning, or going to the urgent care or emergency department after hours or weekend If you have severe back pain compared to today's visit, or numbness of the genitalia, incontinence of bowel or bladder, or develop the inability to walk or stand, then go to the emergency department immediately

## 2019-02-08 ENCOUNTER — Telehealth: Payer: Self-pay | Admitting: Medical

## 2019-02-08 NOTE — Telephone Encounter (Signed)
P.A. XULTOPHY °

## 2019-02-09 NOTE — Telephone Encounter (Signed)
P.A. approved til 02/09/20, called pharmacy & went thru for $540.  Activated discount card and brought down to $90 for 75 day supply.  They are ordering and will be in tomorrow.  Called pt and left message

## 2019-02-10 ENCOUNTER — Other Ambulatory Visit: Payer: Self-pay | Admitting: Medical

## 2019-02-10 ENCOUNTER — Telehealth: Payer: Self-pay | Admitting: Medical

## 2019-02-10 MED ORDER — BD PEN NEEDLE NANO U/F 32G X 4 MM MISC: 1.0000 | Freq: Every day | 3 refills | Status: DC

## 2019-02-10 MED ORDER — SOLIQUA 100-33 UNT-MCG/ML ~~LOC~~ SOPN: 20.0000 [IU] | PEN_INJECTOR | Freq: Every day | SUBCUTANEOUS | 2 refills | Status: DC

## 2019-02-10 NOTE — Telephone Encounter (Signed)
Pt called back & states pharmacy ran [place] thru wife's ins and it paid for it so he would rather stay with that instead of changing to the [facility]

## 2019-04-02 ENCOUNTER — Other Ambulatory Visit: Payer: Self-pay | Admitting: Medical

## 2019-04-02 ENCOUNTER — Encounter: Payer: PRIVATE HEALTH INSURANCE | Admitting: Medical

## 2019-04-09 ENCOUNTER — Encounter: Payer: PRIVATE HEALTH INSURANCE | Admitting: Medical

## 2019-04-09 ENCOUNTER — Encounter (INDEPENDENT_AMBULATORY_CARE_PROVIDER_SITE_OTHER): Payer: PRIVATE HEALTH INSURANCE | Admitting: Ophthalmology

## 2019-04-24 ENCOUNTER — Telehealth: Payer: Self-pay | Admitting: Medical

## 2019-04-24 ENCOUNTER — Other Ambulatory Visit: Payer: Self-pay

## 2019-04-24 ENCOUNTER — Encounter: Payer: Self-pay | Admitting: Medical

## 2019-04-24 ENCOUNTER — Encounter (INDEPENDENT_AMBULATORY_CARE_PROVIDER_SITE_OTHER): Payer: PRIVATE HEALTH INSURANCE | Admitting: Ophthalmology

## 2019-04-24 ENCOUNTER — Ambulatory Visit (INDEPENDENT_AMBULATORY_CARE_PROVIDER_SITE_OTHER): Payer: PRIVATE HEALTH INSURANCE | Admitting: Medical

## 2019-04-24 VITALS — BP 100/70 | HR 75 | Temp 97.7°F | Ht 72.0 in | Wt 208.6 lb

## 2019-04-24 DIAGNOSIS — Z23 Encounter for immunization: Secondary | ICD-10-CM

## 2019-04-24 DIAGNOSIS — Z7189 Other specified counseling: Secondary | ICD-10-CM

## 2019-04-24 DIAGNOSIS — I10 Essential (primary) hypertension: Secondary | ICD-10-CM

## 2019-04-24 DIAGNOSIS — R809 Proteinuria, unspecified: Secondary | ICD-10-CM

## 2019-04-24 DIAGNOSIS — E782 Mixed hyperlipidemia: Secondary | ICD-10-CM

## 2019-04-24 DIAGNOSIS — Z7185 Encounter for immunization safety counseling: Secondary | ICD-10-CM

## 2019-04-24 DIAGNOSIS — M5431 Sciatica, right side: Secondary | ICD-10-CM

## 2019-04-24 DIAGNOSIS — E118 Type 2 diabetes mellitus with unspecified complications: Secondary | ICD-10-CM | POA: Diagnosis not present

## 2019-04-24 DIAGNOSIS — Z0000 Encounter for general adult medical examination without abnormal findings: Secondary | ICD-10-CM

## 2019-04-24 DIAGNOSIS — H35033 Hypertensive retinopathy, bilateral: Secondary | ICD-10-CM | POA: Diagnosis not present

## 2019-04-24 DIAGNOSIS — B351 Tinea unguium: Secondary | ICD-10-CM

## 2019-04-24 DIAGNOSIS — H353122 Nonexudative age-related macular degeneration, left eye, intermediate dry stage: Secondary | ICD-10-CM

## 2019-04-24 DIAGNOSIS — H34831 Tributary (branch) retinal vein occlusion, right eye, with macular edema: Secondary | ICD-10-CM | POA: Diagnosis not present

## 2019-04-24 DIAGNOSIS — F17200 Nicotine dependence, unspecified, uncomplicated: Secondary | ICD-10-CM

## 2019-04-24 DIAGNOSIS — B353 Tinea pedis: Secondary | ICD-10-CM

## 2019-04-24 DIAGNOSIS — H43813 Vitreous degeneration, bilateral: Secondary | ICD-10-CM

## 2019-04-24 DIAGNOSIS — Z139 Encounter for screening, unspecified: Secondary | ICD-10-CM

## 2019-04-24 MED ORDER — CLOTRIMAZOLE-BETAMETHASONE 1-0.05 % EX CREA: 1.0000 "application " | TOPICAL_CREAM | Freq: Two times a day (BID) | CUTANEOUS | 0 refills | Status: DC

## 2019-04-24 NOTE — Telephone Encounter (Signed)
Get copy of eye doctor report, Dr. Zigmund Daniel, visit last week, for diabetic eye check

## 2019-04-24 NOTE — Addendum Note (Signed)
Addended by: Carlena Hurl on: 04/24/2019 10:13 PM   Modules accepted: Orders

## 2019-04-24 NOTE — Progress Notes (Addendum)
Subjective:   HPI  Jeffrey Rodriguez is a 53 y.o. male who presents for physical and recheck on medications Chief Complaint  Patient presents with  . Annual Exam    not fasting     Medical care team includes: Tysinger, Camelia Eng, PA-C here for primary care Dentist Eye doctor Dr. Wilfrid Lund, GI Dr. Rodman Key on [facility], eye doctor   Concerns: None  Diabetes - Using 18 u [place].  Seeing low 100s for fasting glucose.  Exercise - walking. Diet - he notes improvements in diet since last visit.  Using wheat bread, a lot of vegetables , using fruit.  No foot concerns.  Compliant with medications for blood pressure, cholesterol, no chest pain, no difficulty breathing, no other complaint  He has been working long hours, lots of overtime, active on the job  Reviewed their medical, surgical, family, social, medication, and allergy history and updated chart as appropriate.  Past Medical History:  Diagnosis Date  . Diabetes mellitus without complication ([place]) [id number]  . GERD (gastroesophageal reflux disease)   . Hyperlipidemia   . Hypertension 2010  . Microalbuminuria 11/2013  . Mixed dyslipidemia    diet and exercise  . Obesity   . Smoker    0.5ppd  . Wears glasses     Past Surgical History:  Procedure Laterality Date  . COLONOSCOPY  12/2015   tubular adenoma, Dr. Wilfrid Lund  . WISDOM TOOTH EXTRACTION      Social History   Socioeconomic History  . Marital status: Married    Spouse name: Not on file  . Number of children: Not on file  . Years of education: Not on file  . Highest education level: Not on file  Occupational History  . Not on file  Social Needs  . Financial resource strain: Not on file  . Food insecurity    Worry: Not on file    Inability: Not on file  . Transportation needs    Medical: Not on file    Non-medical: Not on file  Tobacco Use  . Smoking status: Current Every Day Smoker    Packs/day: 0.50    Years: 26.00    Pack years: 13.00     Types: Cigarettes  . Smokeless tobacco: Never Used  Substance and Sexual Activity  . Alcohol use: No    Alcohol/week: 0.0 standard drinks  . Drug use: No  . Sexual activity: Not on file  Lifestyle  . Physical activity    Days per week: Not on file    Minutes per session: Not on file  . Stress: Not on file  Relationships  . Social Herbalist on phone: Not on file    Gets together: Not on file    Attends religious service: Not on file    Active member of club or organization: Not on file    Attends meetings of clubs or organizations: Not on file    Relationship status: Not on file  . Intimate partner violence    Fear of current or ex partner: Not on file    Emotionally abused: Not on file    Physically abused: Not on file    Forced sexual activity: Not on file  Other Topics Concern  . Not on file  Social History Narrative   Lives with wife, no children, works for [employer], Glass blower/designer, exercise - walking.   04/2019    Family History  Problem Relation Age of Onset  . Pneumonia Father   .  Arthritis Mother   . Depression Brother   . Hypertension Other   . Cancer Neg Hx   . Stroke Neg Hx   . Colon cancer Neg Hx   . Colon polyps Neg Hx   . Esophageal cancer Neg Hx   . Rectal cancer Neg Hx   . Stomach cancer Neg Hx   . Heart disease Neg Hx   . Diabetes Neg Hx      Current Outpatient Medications:  .  aspirin (EQ ASPIRIN ADULT LOW DOSE) 81 MG EC tablet, Take 1 tablet (81 mg total) by mouth daily. Swallow whole., Disp: 90 tablet, Rfl: 3 .  atorvastatin (LIPITOR) 20 MG tablet, Take 1 tablet by mouth once daily, Disp: 30 tablet, Rfl: 0 .  HYDROcodone-acetaminophen (NORCO) 5-325 MG tablet, Take 1 tablet by mouth every 6 (six) hours as needed for moderate pain., Disp: 12 tablet, Rfl: 0 .  Insulin Glargine-Lixisenatide (SOLIQUA) 100-33 UNT-MCG/ML SOPN, Inject 20 Units into the skin daily., Disp: 6 mL, Rfl: 2 .  Insulin Pen Needle (BD PEN NEEDLE NANO U/F) 32G  X 4 MM MISC, 1 each by Does not apply route at bedtime., Disp: 100 each, Rfl: 3 .  Lancet Devices (ACCU-CHEK SOFTCLIX) lancets, Use as instructed, Disp: 1 each, Rfl: 3 .  lisinopril-hydrochlorothiazide (ZESTORETIC) 20-12.5 MG tablet, Take 1 tablet by mouth once daily, Disp: 30 tablet, Rfl: 0 .  omeprazole (PRILOSEC) 20 MG capsule, Take 1 capsule (20 mg total) by mouth daily as needed. Reported on 01/12/2016, Disp: 90 capsule, Rfl: 3 .  SYNJARDY XR 25-1000 MG TB24, Take 1 tablet by mouth once daily, Disp: 30 tablet, Rfl: 0 .  clotrimazole-betamethasone (LOTRISONE) cream, Apply 1 application topically 2 (two) times daily., Disp: 30 g, Rfl: 0  Current Facility-Administered Medications:  .  triamcinolone acetonide (KENALOG-40) injection 40 mg, 40 mg, Intramuscular, Once, Denita Lung, MD  No Known Allergies   Review of Systems Constitutional: -fever, -chills, -sweats, -unexpected weight change, -decreased appetite, -fatigue Allergy: -sneezing, -itching, -congestion Dermatology: -changing moles, --rash, -lumps ENT: -runny nose, -ear pain, -sore throat, -hoarseness, -sinus pain, -teeth pain, - ringing in ears, -hearing loss, -nosebleeds Cardiology: -chest pain, -palpitations, -swelling, -difficulty breathing when lying flat, -waking up short of breath Respiratory: -cough, -shortness of breath, -difficulty breathing with exercise or exertion, -wheezing, -coughing up blood Gastroenterology: -abdominal pain, -nausea, -vomiting, -diarrhea, -constipation, -blood in stool, -changes in bowel movement, -difficulty swallowing or eating Hematology: -bleeding, -bruising  Musculoskeletal: -joint aches, -muscle aches, -joint swelling, -back pain, -neck pain, -cramping, -changes in gait Ophthalmology: denies vision changes, eye redness, itching, discharge Urology: -burning with urination, -difficulty urinating, -blood in urine, -urinary frequency, -urgency, -incontinence Neurology: -headache, -weakness,  -tingling, -numbness, -memory loss, -falls, -dizziness Psychology: -depressed mood, -agitation, -sleep problems -polyuria, -polydipsia      Objective:   BP 100/70   Pulse 75   Temp 97.7 F (36.5 C)   Ht 6' (1.829 m)   Wt 208 lb 9.6 oz (94.6 kg)   SpO2 96%   BMI 28.29 kg/m   General appearance: alert, no distress, WD/WN, African American male Skin: bilat great toenails with greenish coloration and thickened nails, maceration mild between fourth and fifth toes bilateral consistent with tinea pedis no new worrisome lesions HEENT: normocephalic, conjunctiva/corneas normal, sclerae anicteric, PERRLA, EOMi, nares patent, no discharge or erythema, pharynx normal Oral cavity: MMM, tongue normal, teeth with no obvious deformity Neck: supple, no lymphadenopathy, no thyromegaly, no masses, normal ROM, no bruits Chest: non tender, normal shape  and expansion Heart: RRR, normal S1, S2, no murmurs Lungs: CTA bilaterally, no wheezes, rhonchi, or rales Abdomen: +bs, soft, non tender, non distended, no masses, no hepatomegaly, no splenomegaly, no bruits Back: non tender, normal ROM, no scoliosis Musculoskeletal: upper extremities non tender, no obvious deformity, normal ROM throughout, lower extremities non tender, no obvious deformity, normal ROM throughout Extremities: no edema, no cyanosis, no clubbing Pulses: 2+ symmetric, upper and lower extremities, normal cap refill Neurological: alert, oriented x 3, CN2-12 intact, strength normal upper extremities and lower extremities, sensation normal throughout, DTRs 2+ throughout, no cerebellar signs, gait normal Psychiatric: normal affect, behavior normal, pleasant  GU: normal male external genitalia,circumcised, 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, otherwise non tender, no masses, no hernia, no lymphadenopathy Rectal: declined  Diabetic Foot Exam - Simple   Simple Foot Form Diabetic Foot exam was performed with the  following findings: Yes 04/24/2019 10:12 PM  Visual Inspection See comments: Yes Sensation Testing Intact to touch and monofilament testing bilaterally: Yes Pulse Check Posterior Tibialis and Dorsalis pulse intact bilaterally: Yes Comments Maceration between fourth and fifth toes bilaterally, great toenails bilaterally with greenish thickened nails consistent with onychomycosis      Assessment and Plan :    Encounter Diagnoses  Name Primary?  . Routine general medical examination at a health care facility Yes  . Diabetes mellitus with complication ([place])   . Microalbuminuria   . Essential hypertension   . Mixed dyslipidemia   . Sciatica of right side   . Onychomycosis   . Tinea pedis of both feet   . Smoker   . Need for influenza vaccination   . Vaccine counseling   . Screening for condition     Physical exam - discussed and counseled on healthy lifestyle, diet, exercise, preventative care, vaccinations, sick and well care, proper use of emergency dept and after hours care, and addressed their concerns.    Health screening: See your eye doctor yearly for routine vision care. See your dentist yearly for routine dental care including hygiene visits twice yearly.  Cancer screening Reviewed 2017 colonoscopy, due repeat in 2022.  Sent home with stool cards x3 for screen  Discussed PSA, prostate exam, and prostate cancer screening risks/benefits.      Discussed chest lung cancer screen and discussed ultrasound screen for AAA.    He will consider after first of year in 2021.     Vaccinations: Counseled on the following vaccines:  Counseled on the influenza virus vaccine.  Vaccine information sheet given.  Influenza vaccine given after consent obtained.  Counseled on Shingrix, he will check insurance coverage.  Up to date on Td and pneumococcal 23   Separate significant chronic issues discussed: Diabetes:-Continue current medication Cholesterol-continue current  medication Hypertension-continue current medication  Tinea pedis- begin Lotrisone for the next 2 weeks in between toes and for the rash in the axilla.  If not resolving, then call back  Onychomycosis-consider retrial of Lamisil oral  Smoking-advised cessation.  He is not ready to quit and Karthikeyan for physical exam has been noticed [place] on the wound [place] on the wound bed to give him a  Noha was seen today for annual exam.  Diagnoses and all orders for this visit:  Routine general medical examination at a health care facility -     Hepatitis B surface antibody,quantitative -     Comprehensive metabolic panel -     CBC with Differential/Platelet -     Hemoglobin A1c -  Microalbumin/Creatinine Ratio, Urine  Diabetes mellitus with complication (HCC) -     Hepatitis B surface antibody,quantitative -     Hemoglobin A1c -     Microalbumin/Creatinine Ratio, Urine  Microalbuminuria -     Comprehensive metabolic panel  Essential hypertension  Mixed dyslipidemia  Sciatica of right side  Onychomycosis  Tinea pedis of both feet  Smoker  Need for influenza vaccination  Vaccine counseling  Screening for condition -     Hepatitis B surface antibody,quantitative  Other orders -     clotrimazole-betamethasone (LOTRISONE) cream; Apply 1 application topically 2 (two) times daily.    Follow-up pending labs, yearly for physical

## 2019-04-25 LAB — COMPREHENSIVE METABOLIC PANEL
ALT: 19 IU/L (ref 0–44)
AST: 18 IU/L (ref 0–40)
Albumin/Globulin Ratio: 1.5 (ref 1.2–2.2)
Albumin: 4.6 g/dL (ref 3.8–4.9)
Alkaline Phosphatase: 98 IU/L (ref 39–117)
BUN/Creatinine Ratio: 9 (ref 9–20)
BUN: 9 mg/dL (ref 6–24)
Bilirubin Total: 0.4 mg/dL (ref 0.0–1.2)
CO2: 29 mmol/L (ref 20–29)
Calcium: 9.3 mg/dL (ref 8.7–10.2)
Chloride: 100 mmol/L (ref 96–106)
Creatinine, Ser: 0.96 mg/dL (ref 0.76–1.27)
GFR calc Af Amer: 102 mL/min/{1.73_m2} (ref >59)
GFR calc non Af Amer: 89 mL/min/{1.73_m2} (ref >59)
Globulin, Total: 3 g/dL (ref 1.5–4.5)
Glucose: 102 mg/dL — ABNORMAL HIGH (ref 65–99)
Potassium: 3.5 mmol/L (ref 3.5–5.2)
Sodium: 140 mmol/L (ref 134–144)
Total Protein: 7.6 g/dL (ref 6.0–8.5)

## 2019-04-25 LAB — MICROALBUMIN / CREATININE URINE RATIO
Creatinine, Urine: 77.7 mg/dL
Microalb/Creat Ratio: <4 mg/g creat (ref 0–29)
Microalbumin, Urine: <3 ug/mL

## 2019-04-25 LAB — CBC WITH DIFFERENTIAL/PLATELET
Basophils Absolute: 0 10*3/uL (ref 0.0–0.2)
Basos: 0 %
EOS (ABSOLUTE): 0.1 10*3/uL (ref 0.0–0.4)
Eos: 1 %
Hematocrit: 47.2 % (ref 37.5–51.0)
Hemoglobin: 16.1 g/dL (ref 13.0–17.7)
Immature Grans (Abs): 0 10*3/uL (ref 0.0–0.1)
Immature Granulocytes: 0 %
Lymphocytes Absolute: 1.6 10*3/uL (ref 0.7–3.1)
Lymphs: 28 %
MCH: 28.8 pg (ref 26.6–33.0)
MCHC: 34.1 g/dL (ref 31.5–35.7)
MCV: 84 fL (ref 79–97)
Monocytes Absolute: 0.4 10*3/uL (ref 0.1–0.9)
Monocytes: 7 %
Neutrophils Absolute: 3.6 10*3/uL (ref 1.4–7.0)
Neutrophils: 64 %
Platelets: 255 10*3/uL (ref 150–450)
RBC: 5.59 x10E6/uL (ref 4.14–5.80)
RDW: 14.1 % (ref 11.6–15.4)
WBC: 5.8 10*3/uL (ref 3.4–10.8)

## 2019-04-25 LAB — HEMOGLOBIN A1C
Est. average glucose Bld gHb Est-mCnc: 128 mg/dL
Hgb A1c MFr Bld: 6.1 % — ABNORMAL HIGH (ref 4.8–5.6)

## 2019-04-25 LAB — HEPATITIS B SURFACE ANTIBODY, QUANTITATIVE: Hepatitis B Surf Ab Quant: <3.1 m[IU]/mL — ABNORMAL LOW (ref >9.9)

## 2019-04-27 ENCOUNTER — Other Ambulatory Visit: Payer: Self-pay | Admitting: Medical

## 2019-04-27 MED ORDER — LISINOPRIL-HYDROCHLOROTHIAZIDE 20-12.5 MG PO TABS: 1.0000 | ORAL_TABLET | Freq: Every day | ORAL | 3 refills | Status: DC

## 2019-04-27 MED ORDER — ASPIRIN 81 MG PO TBEC: 81.0000 mg | DELAYED_RELEASE_TABLET | Freq: Every day | ORAL | 3 refills | Status: DC

## 2019-04-27 MED ORDER — OMEPRAZOLE 20 MG PO CPDR: 20.0000 mg | DELAYED_RELEASE_CAPSULE | Freq: Every day | ORAL | 3 refills | Status: DC | PRN

## 2019-04-27 MED ORDER — ATORVASTATIN CALCIUM 20 MG PO TABS: 20.0000 mg | ORAL_TABLET | Freq: Every day | ORAL | 3 refills | Status: DC

## 2019-04-27 MED ORDER — SOLIQUA 100-33 UNT-MCG/ML ~~LOC~~ SOPN: 20.0000 [IU] | PEN_INJECTOR | Freq: Every day | SUBCUTANEOUS | 2 refills | Status: DC

## 2019-04-27 MED ORDER — SYNJARDY XR 25-1000 MG PO TB24: 1.0000 | ORAL_TABLET | Freq: Every day | ORAL | 3 refills | Status: DC

## 2019-04-27 NOTE — Telephone Encounter (Signed)
forwarding to [facility]

## 2019-04-29 DIAGNOSIS — Z23 Encounter for immunization: Secondary | ICD-10-CM | POA: Diagnosis not present

## 2019-04-29 NOTE — Addendum Note (Signed)
Addended by: Edgar Frisk on: 04/29/2019 09:42 AM   Modules accepted: Orders

## 2019-05-02 ENCOUNTER — Telehealth: Payer: Self-pay | Admitting: Medical

## 2019-05-02 NOTE — Telephone Encounter (Signed)
P.A. SOLIQUA completed

## 2019-05-03 NOTE — Telephone Encounter (Signed)
PA approved.

## 2019-05-06 ENCOUNTER — Other Ambulatory Visit: Payer: Self-pay

## 2019-05-06 ENCOUNTER — Ambulatory Visit (HOSPITAL_COMMUNITY)
Admission: EM | Admit: 2019-05-06 | Discharge: 2019-05-06 | Disposition: A | Discharge status: Home / Self Care | Payer: BC Managed Care – PPO | Attending: Urgent Care | Admitting: Urgent Care

## 2019-05-06 ENCOUNTER — Encounter (HOSPITAL_COMMUNITY): Payer: Self-pay

## 2019-05-06 DIAGNOSIS — S8012XA Contusion of left lower leg, initial encounter: Secondary | ICD-10-CM

## 2019-05-06 DIAGNOSIS — I10 Essential (primary) hypertension: Secondary | ICD-10-CM

## 2019-05-06 DIAGNOSIS — R600 Localized edema: Secondary | ICD-10-CM

## 2019-05-06 DIAGNOSIS — E119 Type 2 diabetes mellitus without complications: Secondary | ICD-10-CM

## 2019-05-06 DIAGNOSIS — E782 Mixed hyperlipidemia: Secondary | ICD-10-CM

## 2019-05-06 MED ORDER — DICLOFENAC SODIUM 1 % TD GEL: 2.0000 g | Freq: Four times a day (QID) | TRANSDERMAL | 0 refills | Status: DC

## 2019-05-06 NOTE — Discharge Instructions (Signed)
Please make sure you purchase compression stockings with a pressure of about 10-20 mmHg.  These will help with your lower leg swelling, make sure you wear them especially during your work days.  If you develop redness, hot sensation of your calf and pain of your calf then return to our clinic before 4 PM so that we can try and obtain an ultrasound to rule out DVT.  However, I have very low suspicion that this is what is going on with the.  Make sure you use the diclofenac gel over the portion of your leg that has a contusion/where you hit your leg.

## 2019-05-06 NOTE — ED Provider Notes (Signed)
MRN: [id number] DOB: 04/13/1966  Subjective:   Loal Berwanger is a 53 y.o. male presenting for 1 week history of persistent left lower leg soreness and swelling.  Patient reports that he hit his leg while at work about a week ago and was some mild injury but is worried that his symptoms have persisted.  Denies any redness, warmth, fever, chest pain, shortness of breath, heart racing, nausea, vomiting, belly pain, bleeding or drainage of pus from any wound.  He has not tried medications for relief.  He has well-controlled diabetes, HTN, HL.  Patient is a smoker, has about 1/2ppd.  Denies any long distance travel, history of clots, recent surgeries.   Current Facility-Administered Medications:  .  triamcinolone acetonide (KENALOG-40) injection 40 mg, 40 mg, Intramuscular, Once, Denita Lung, MD  Current Outpatient Medications:  .  aspirin (EQ ASPIRIN ADULT LOW DOSE) 81 MG EC tablet, Take 1 tablet (81 mg total) by mouth daily. Swallow whole., Disp: 90 tablet, Rfl: 3 .  atorvastatin (LIPITOR) 20 MG tablet, Take 1 tablet (20 mg total) by mouth daily., Disp: 90 tablet, Rfl: 3 .  clotrimazole-betamethasone (LOTRISONE) cream, Apply 1 application topically 2 (two) times daily., Disp: 30 g, Rfl: 0 .  Empagliflozin-metFORMIN HCl ER (SYNJARDY XR) 25-1000 MG TB24, Take 1 tablet by mouth daily., Disp: 90 tablet, Rfl: 3 .  HYDROcodone-acetaminophen (NORCO) 5-325 MG tablet, Take 1 tablet by mouth every 6 (six) hours as needed for moderate pain., Disp: 12 tablet, Rfl: 0 .  Insulin Glargine-Lixisenatide (SOLIQUA) 100-33 UNT-MCG/ML SOPN, Inject 20 Units into the skin daily., Disp: 6 mL, Rfl: 2 .  Insulin Pen Needle (BD PEN NEEDLE NANO U/F) 32G X 4 MM MISC, 1 each by Does not apply route at bedtime., Disp: 100 each, Rfl: 3 .  Lancet Devices (ACCU-CHEK SOFTCLIX) lancets, Use as instructed, Disp: 1 each, Rfl: 3 .  lisinopril-hydrochlorothiazide (ZESTORETIC) 20-12.5 MG tablet, Take 1 tablet by mouth daily., Disp: 90  tablet, Rfl: 3 .  omeprazole (PRILOSEC) 20 MG capsule, Take 1 capsule (20 mg total) by mouth daily as needed. Reported on 01/12/2016, Disp: 90 capsule, Rfl: 3    No Known Allergies   Past Medical History:  Diagnosis Date  . Diabetes mellitus without complication ([place]) [id number]  . GERD (gastroesophageal reflux disease)   . Hyperlipidemia   . Hypertension 2010  . Microalbuminuria 11/2013  . Mixed dyslipidemia    diet and exercise  . Obesity   . Smoker    0.5ppd  . Wears glasses      Past Surgical History:  Procedure Laterality Date  . COLONOSCOPY  12/2015   tubular adenoma, Dr. Wilfrid Lund  . WISDOM TOOTH EXTRACTION      Social History   Tobacco Use  . Smoking status: Current Every Day Smoker    Packs/day: 0.50    Years: 26.00    Pack years: 13.00    Types: Cigarettes  . Smokeless tobacco: Never Used  Substance Use Topics  . Alcohol use: No    Alcohol/week: 0.0 standard drinks  . Drug use: No    ROS  Objective:   Vitals: BP 122/81 (BP Location: Left Arm)   Pulse 86   Temp 98.9 F (37.2 C) (Oral)   Resp 18   SpO2 98%   Physical Exam Constitutional:      General: He is not in acute distress.    Appearance: Normal appearance. He is well-developed. He is not ill-appearing, toxic-appearing or diaphoretic.  HENT:  Head: Normocephalic and atraumatic.     Right Ear: External ear normal.     Left Ear: External ear normal.     Nose: Nose normal.     Mouth/Throat:     Mouth: Mucous membranes are moist.     Pharynx: Oropharynx is clear.  Eyes:     General: No scleral icterus.       Right eye: No discharge.        Left eye: No discharge.     Extraocular Movements: Extraocular movements intact.     Pupils: Pupils are equal, round, and reactive to light.  Cardiovascular:     Rate and Rhythm: Normal rate and regular rhythm.     Heart sounds: Normal heart sounds. No murmur. No friction rub. No gallop.   Pulmonary:     Effort: Pulmonary effort is normal. No  respiratory distress.     Breath sounds: Normal breath sounds. No stridor. No wheezing, rhonchi or rales.  Musculoskeletal:     Right lower leg: He exhibits no tenderness, no bony tenderness, no swelling, no deformity and no laceration. Edema (trace) present.     Left lower leg: He exhibits tenderness (negative Homan sign). He exhibits no bony tenderness, no swelling, no deformity and no laceration. Edema (1+ up to mid calf) present.  Neurological:     Mental Status: He is alert and oriented to person, place, and time.  Psychiatric:        Mood and Affect: Mood normal.        Behavior: Behavior normal.        Thought Content: Thought content normal.        Judgment: Judgment normal.      Assessment and Plan :   1. Bilateral lower extremity edema   2. Essential hypertension   3. Well controlled diabetes mellitus ([place])   4. Mixed hyperlipidemia   5. Contusion of left leg, initial encounter     Suspect venous insufficiency concurrent with contusion of anterior portion of left lower leg.  Counseled patient on conservative management for his contusion with use of diclofenac gel, wear compression stockings while at work.  Low risk for DVT.  Follow-up with PCP. Counseled patient on potential for adverse effects with medications prescribed/recommended today, ER and return-to-clinic precautions discussed, patient verbalized understanding.    Jaynee Eagles, PA-C 05/06/19 1757

## 2019-05-06 NOTE — ED Triage Notes (Signed)
Pt presents with left leg swelling and soreness after a fall about a week ago.

## 2019-05-08 ENCOUNTER — Other Ambulatory Visit: Payer: Self-pay

## 2019-05-08 ENCOUNTER — Other Ambulatory Visit (INDEPENDENT_AMBULATORY_CARE_PROVIDER_SITE_OTHER): Payer: PRIVATE HEALTH INSURANCE

## 2019-05-08 DIAGNOSIS — Z1211 Encounter for screening for malignant neoplasm of colon: Secondary | ICD-10-CM | POA: Diagnosis not present

## 2019-05-08 LAB — HEMOCCULT GUIAC POC 1CARD (OFFICE): Fecal Occult Blood, POC: NEGATIVE

## 2019-05-18 NOTE — Telephone Encounter (Signed)
P.A. approved, left message for pt  °

## 2019-06-26 ENCOUNTER — Encounter (INDEPENDENT_AMBULATORY_CARE_PROVIDER_SITE_OTHER): Payer: PRIVATE HEALTH INSURANCE | Admitting: Ophthalmology

## 2019-07-23 ENCOUNTER — Encounter (INDEPENDENT_AMBULATORY_CARE_PROVIDER_SITE_OTHER): Payer: PRIVATE HEALTH INSURANCE | Admitting: Ophthalmology

## 2019-08-07 ENCOUNTER — Other Ambulatory Visit: Payer: Self-pay

## 2019-08-07 ENCOUNTER — Encounter (INDEPENDENT_AMBULATORY_CARE_PROVIDER_SITE_OTHER): Payer: PRIVATE HEALTH INSURANCE | Admitting: Ophthalmology

## 2019-08-07 DIAGNOSIS — I10 Essential (primary) hypertension: Secondary | ICD-10-CM

## 2019-08-07 DIAGNOSIS — H43813 Vitreous degeneration, bilateral: Secondary | ICD-10-CM

## 2019-08-07 DIAGNOSIS — H34831 Tributary (branch) retinal vein occlusion, right eye, with macular edema: Secondary | ICD-10-CM | POA: Diagnosis not present

## 2019-08-07 DIAGNOSIS — H2513 Age-related nuclear cataract, bilateral: Secondary | ICD-10-CM

## 2019-08-07 DIAGNOSIS — H35033 Hypertensive retinopathy, bilateral: Secondary | ICD-10-CM

## 2019-08-07 LAB — HM DIABETES EYE EXAM

## 2019-08-14 ENCOUNTER — Telehealth: Payer: Self-pay | Admitting: Medical

## 2019-08-14 NOTE — Telephone Encounter (Signed)
Received requested records from [facility]

## 2019-08-25 ENCOUNTER — Encounter: Payer: Self-pay | Admitting: Medical

## 2019-09-11 ENCOUNTER — Other Ambulatory Visit: Payer: Self-pay

## 2019-09-11 ENCOUNTER — Encounter (INDEPENDENT_AMBULATORY_CARE_PROVIDER_SITE_OTHER): Payer: PRIVATE HEALTH INSURANCE | Admitting: Ophthalmology

## 2019-09-11 DIAGNOSIS — H43813 Vitreous degeneration, bilateral: Secondary | ICD-10-CM

## 2019-09-11 DIAGNOSIS — H35033 Hypertensive retinopathy, bilateral: Secondary | ICD-10-CM

## 2019-09-11 DIAGNOSIS — H34831 Tributary (branch) retinal vein occlusion, right eye, with macular edema: Secondary | ICD-10-CM

## 2019-09-11 DIAGNOSIS — H353122 Nonexudative age-related macular degeneration, left eye, intermediate dry stage: Secondary | ICD-10-CM

## 2019-09-11 DIAGNOSIS — H35711 Central serous chorioretinopathy, right eye: Secondary | ICD-10-CM

## 2019-09-11 DIAGNOSIS — I10 Essential (primary) hypertension: Secondary | ICD-10-CM | POA: Diagnosis not present

## 2019-09-12 ENCOUNTER — Other Ambulatory Visit: Payer: Self-pay | Admitting: Medical

## 2019-09-12 DIAGNOSIS — E118 Type 2 diabetes mellitus with unspecified complications: Secondary | ICD-10-CM

## 2019-09-14 ENCOUNTER — Telehealth: Payer: Self-pay | Admitting: Medical

## 2019-09-14 NOTE — Telephone Encounter (Signed)
That is fine, we can do BMET lab here for potassium.   I received an office note from specialist today but already signed off on.  Please send this back as it is probably in the scan pile.

## 2019-09-14 NOTE — Telephone Encounter (Signed)
Pt called and said his eye dr is requesting him to have blood work done at his pcp to check his potassium levels. Pt wants to schedule a lab visit for next week but I wanted to check with you first before scheduling.

## 2019-09-15 ENCOUNTER — Other Ambulatory Visit: Payer: Self-pay | Admitting: Medical

## 2019-09-15 DIAGNOSIS — E876 Hypokalemia: Secondary | ICD-10-CM

## 2019-09-15 NOTE — Telephone Encounter (Signed)
Pt scheduled for lab next tue. Form given to [facility]

## 2019-09-22 ENCOUNTER — Other Ambulatory Visit: Payer: PRIVATE HEALTH INSURANCE

## 2019-09-22 ENCOUNTER — Other Ambulatory Visit: Payer: Self-pay

## 2019-09-22 DIAGNOSIS — E876 Hypokalemia: Secondary | ICD-10-CM

## 2019-09-23 LAB — POTASSIUM: Potassium: 3.4 mmol/L — ABNORMAL LOW (ref 3.5–5.2)

## 2019-09-28 ENCOUNTER — Ambulatory Visit: Payer: PRIVATE HEALTH INSURANCE | Attending: Internal Medicine

## 2019-11-06 ENCOUNTER — Encounter (INDEPENDENT_AMBULATORY_CARE_PROVIDER_SITE_OTHER): Payer: PRIVATE HEALTH INSURANCE | Admitting: Ophthalmology

## 2019-11-10 ENCOUNTER — Other Ambulatory Visit: Payer: Self-pay

## 2019-11-10 ENCOUNTER — Telehealth: Payer: Self-pay | Admitting: Medical

## 2019-11-10 MED ORDER — SYNJARDY XR 25-1000 MG PO TB24: 1.0000 | ORAL_TABLET | Freq: Every day | ORAL | 0 refills | Status: DC

## 2019-11-10 NOTE — Telephone Encounter (Signed)
Medication has been sent to the pharmacy. Patient has an appointment scheduled.

## 2019-11-10 NOTE — Telephone Encounter (Signed)
Pt called and said he needs a refill on Synjardy sent to the [facility] on [address]. Pt stated that he has been out of this medication for a week

## 2019-11-10 NOTE — Telephone Encounter (Signed)
Send refill.  He is due for either fasting physical or fasting diabetes check.  Last visit 04/2019 I believe.

## 2019-11-23 ENCOUNTER — Telehealth: Payer: Self-pay

## 2019-11-23 ENCOUNTER — Other Ambulatory Visit: Payer: Self-pay

## 2019-11-23 ENCOUNTER — Encounter (INDEPENDENT_AMBULATORY_CARE_PROVIDER_SITE_OTHER): Payer: PRIVATE HEALTH INSURANCE | Admitting: Ophthalmology

## 2019-11-23 DIAGNOSIS — H35033 Hypertensive retinopathy, bilateral: Secondary | ICD-10-CM

## 2019-11-23 DIAGNOSIS — I10 Essential (primary) hypertension: Secondary | ICD-10-CM

## 2019-11-23 DIAGNOSIS — H35711 Central serous chorioretinopathy, right eye: Secondary | ICD-10-CM

## 2019-11-23 DIAGNOSIS — H353122 Nonexudative age-related macular degeneration, left eye, intermediate dry stage: Secondary | ICD-10-CM

## 2019-11-23 DIAGNOSIS — H34831 Tributary (branch) retinal vein occlusion, right eye, with macular edema: Secondary | ICD-10-CM | POA: Diagnosis not present

## 2019-11-23 DIAGNOSIS — H43813 Vitreous degeneration, bilateral: Secondary | ICD-10-CM

## 2019-11-23 NOTE — Telephone Encounter (Signed)
I submitted a PA for the pts. Jeffrey Rodriguez and it was approved from 11/09/19-11/07/20.

## 2019-12-10 ENCOUNTER — Ambulatory Visit: Payer: PRIVATE HEALTH INSURANCE | Admitting: Medical

## 2019-12-10 ENCOUNTER — Encounter: Payer: Self-pay | Admitting: Medical

## 2019-12-10 ENCOUNTER — Other Ambulatory Visit: Payer: Self-pay

## 2019-12-10 VITALS — BP 110/74 | HR 75 | Ht 72.0 in | Wt 196.2 lb

## 2019-12-10 DIAGNOSIS — F17200 Nicotine dependence, unspecified, uncomplicated: Secondary | ICD-10-CM

## 2019-12-10 DIAGNOSIS — Z7189 Other specified counseling: Secondary | ICD-10-CM

## 2019-12-10 DIAGNOSIS — I10 Essential (primary) hypertension: Secondary | ICD-10-CM | POA: Diagnosis not present

## 2019-12-10 DIAGNOSIS — B351 Tinea unguium: Secondary | ICD-10-CM | POA: Diagnosis not present

## 2019-12-10 DIAGNOSIS — E118 Type 2 diabetes mellitus with unspecified complications: Secondary | ICD-10-CM

## 2019-12-10 DIAGNOSIS — B353 Tinea pedis: Secondary | ICD-10-CM

## 2019-12-10 DIAGNOSIS — Z79899 Other long term (current) drug therapy: Secondary | ICD-10-CM

## 2019-12-10 DIAGNOSIS — R809 Proteinuria, unspecified: Secondary | ICD-10-CM

## 2019-12-10 DIAGNOSIS — E782 Mixed hyperlipidemia: Secondary | ICD-10-CM

## 2019-12-10 DIAGNOSIS — Z7185 Encounter for immunization safety counseling: Secondary | ICD-10-CM

## 2019-12-10 LAB — HEMOGLOBIN A1C
Est. average glucose Bld gHb Est-mCnc: 117 mg/dL
Hgb A1c MFr Bld: 5.7 % — ABNORMAL HIGH (ref 4.8–5.6)

## 2019-12-10 MED ORDER — TERBINAFINE HCL 250 MG PO TABS: 250.0000 mg | ORAL_TABLET | Freq: Every day | ORAL | 0 refills | Status: DC

## 2019-12-10 NOTE — Patient Instructions (Addendum)
Recommendations  Toenail fungus - begin Lamisil oral tablet.    Follow-up in 4 months weeks for labs  Vaccines: Please get [place] a copy of your Covid vaccine card  After 4 weeks from the last Covid vaccine you can do other vaccines.  You are due for the 2 shot shingles vaccine .Shingles vaccine:  I recommend you have a shingles vaccine to help prevent shingles or herpes zoster outbreak.   Please call your insurer to inquire about coverage for the Shingrix vaccine given in 2 doses.   Some insurers cover this vaccine after age 9, some cover this after age 40.  If your insurer covers this, then call to schedule appointment to have this vaccine here.  You are due for the 2 shot Heplisav B hepatitis B vaccine, 2 shots, 1 month apart   Continue your current medications   We will call with the hemoglobin A1c lab result   Continue healthy diet and regular exercise

## 2019-12-10 NOTE — Progress Notes (Signed)
Subjective: Chief Complaint  Patient presents with  . Diabetes   Here for med check  Jeffrey Rodriguez has hx/o diabetes, high blood pressure, high cholesterol, microalbuminuria, smoker here today for med check.  Diabetes-compliant with Synjardy XR 25/1000 mg daily, Soliqua 20 units daily.  No polyuria, polydipsia, blurred vision, blood sugars been running normal.  Feels fine.  He actively lost some more weight  Hyperlipidemia-compliant with Lipitor 20 mg daily, aspirin 81 mg daily.  No adverse effects of medication  Hypertension-compliant with lisinopril HCT 20/12.5 mg daily  He had a recent lab work  No new issues  Past Medical History:  Diagnosis Date  . Diabetes mellitus without complication ([place]) [id number]  . GERD (gastroesophageal reflux disease)   . Hyperlipidemia   . Hypertension 2010  . Microalbuminuria 11/2013  . Mixed dyslipidemia    diet and exercise  . Obesity   . Smoker    0.5ppd  . Wears glasses    Current Outpatient Medications on File Prior to Visit  Medication Sig Dispense Refill  . aspirin (EQ ASPIRIN ADULT LOW DOSE) 81 MG EC tablet Take 1 tablet (81 mg total) by mouth daily. Swallow whole. 90 tablet 3  . atorvastatin (LIPITOR) 20 MG tablet Take 1 tablet (20 mg total) by mouth daily. 90 tablet 3  . Empagliflozin-metFORMIN HCl ER (SYNJARDY XR) 25-1000 MG TB24 Take 1 tablet by mouth daily. 90 tablet 0  . Insulin Glargine-Lixisenatide (SOLIQUA) 100-33 UNT-MCG/ML SOPN Inject 20 Units into the skin daily. 6 mL 2  . Insulin Pen Needle (BD PEN NEEDLE NANO U/F) 32G X 4 MM MISC 1 each by Does not apply route at bedtime. 100 each 3  . Lancet Devices (ACCU-CHEK SOFTCLIX) lancets Use as instructed 1 each 3  . lisinopril-hydrochlorothiazide (ZESTORETIC) 20-12.5 MG tablet Take 1 tablet by mouth daily. 90 tablet 3  . omeprazole (PRILOSEC) 20 MG capsule Take 1 capsule (20 mg total) by mouth daily as needed. Reported on 01/12/2016 90 capsule 3   No current facility-administered  medications on file prior to visit.   ROS as in subjective    Objective: BP 110/74   Pulse 75   Ht 6' (1.829 m)   Wt 196 lb 3.2 oz (89 kg)   SpO2 97%   BMI 26.61 kg/m   BP Readings from Last 3 Encounters:  12/10/19 110/74  05/06/19 122/81  04/24/19 100/70   Wt Readings from Last 3 Encounters:  12/10/19 196 lb 3.2 oz (89 kg)  04/24/19 208 lb 9.6 oz (94.6 kg)  01/30/19 212 lb 3.2 oz (96.3 kg)   General appearence: alert, no distress, WD/WN,  Neck: supple, no lymphadenopathy, no thyromegaly, no masses, on bruits Heart: RRR, normal S1, S2, no murmurs Lungs: CTA bilaterally, no wheezes, rhonchi, or rales Pulses: 2+ symmetric, upper and lower extremities, normal cap refill Ext: no edema   Diabetic Foot Exam - Simple   Simple Foot Form Diabetic Foot exam was performed with the following findings: Yes 12/10/2019 10:30 AM  Visual Inspection See comments: Yes Sensation Testing Intact to touch and monofilament testing bilaterally: Yes Pulse Check Posterior Tibialis and Dorsalis pulse intact bilaterally: Yes Comments Discolored brownish-orange thickened great toenails bilaterally otherwise nails relatively normal-looking, no other abnormal findings today      Assessment: Encounter Diagnoses  Name Primary?  . Essential hypertension Yes  . Diabetes mellitus with complication ([place])   . Onychomycosis   . Smoker   . Microalbuminuria   . Mixed dyslipidemia   . Vaccine counseling   .  Tinea pedis, unspecified laterality   . High risk medication use     Plan: Hypertension-your blood pressures are at goal.  Continue current medications  High cholesterol-continue atorvastatin and aspirin daily.  I reviewed labs from December which are at goal for cholesterol  Onychomycosis-begin Lamisil oral tablet.  Discussed risk and benefits of medication.  Follow-up in 4 months weeks for labs  Diabetes-hemoglobin A1c today.  Continue current medicines.  Continue healthy diet and  exercise.  Congratulated him on his weight loss  Counseled on vaccines today.  You are due for the 2 shot shingles vaccine .Shingles vaccine:  I recommend you have a shingles vaccine to help prevent shingles or herpes zoster outbreak.   Please call your insurer to inquire about coverage for the Shingrix vaccine given in 2 doses.   Some insurers cover this vaccine after age 44, some cover this after age 55.  If your insurer covers this, then call to schedule appointment to have this vaccine here.  You are due for the 2 shot Heplisav B hepatitis B vaccine, 2 shots, 1 month apart  Tinea pedis-resolved  Counseled on smoking cessation   Jeffrey Rodriguez was seen today for diabetes.  Diagnoses and all orders for this visit:  Essential hypertension  Diabetes mellitus with complication ([place]) -     Hemoglobin A1c  Onychomycosis -     Comprehensive metabolic panel; Future  Smoker  Microalbuminuria -     Hemoglobin A1c  Mixed dyslipidemia  Vaccine counseling  Tinea pedis, unspecified laterality  High risk medication use -     Comprehensive metabolic panel; Future  Other orders -     terbinafine (LAMISIL) 250 MG tablet; Take 1 tablet (250 mg total) by mouth daily.    F/u 4 weeks for lab

## 2019-12-10 NOTE — Progress Notes (Signed)
done

## 2019-12-11 ENCOUNTER — Other Ambulatory Visit: Payer: Self-pay | Admitting: Medical

## 2019-12-11 ENCOUNTER — Telehealth: Payer: Self-pay | Admitting: Internal Medicine

## 2019-12-11 MED ORDER — SOLIQUA 100-33 UNT-MCG/ML ~~LOC~~ SOPN: 20.0000 [IU] | PEN_INJECTOR | Freq: Every day | SUBCUTANEOUS | 5 refills | Status: DC

## 2019-12-11 NOTE — Telephone Encounter (Signed)
Please help.  Your prior message recently said Iva Boop was approved, so not sure what happened at pharmacy?  Audelia Acton

## 2019-12-11 NOTE — Telephone Encounter (Signed)
synjardy xr is $149.00 #90 and pt can not afford that. Please advise.

## 2019-12-16 ENCOUNTER — Telehealth: Payer: Self-pay

## 2019-12-16 ENCOUNTER — Other Ambulatory Visit: Payer: Self-pay | Admitting: Medical

## 2019-12-16 MED ORDER — METFORMIN HCL 1000 MG PO TABS: 1000.0000 mg | ORAL_TABLET | Freq: Every day | ORAL | 0 refills | Status: DC

## 2019-12-16 MED ORDER — EMPAGLIFLOZIN 25 MG PO TABS: 25.0000 mg | ORAL_TABLET | Freq: Every day | ORAL | 0 refills | Status: DC

## 2019-12-16 NOTE — Telephone Encounter (Signed)
error 

## 2019-12-16 NOTE — Telephone Encounter (Signed)
Since his insurance is no longer covering Synjardy XR, I sent a separate components Jardiance plus Metformin separately to the pharmacy

## 2019-12-16 NOTE — Telephone Encounter (Signed)
Pt. Aware that medication was changed and sent to his pharmacy.

## 2019-12-16 NOTE — Telephone Encounter (Signed)
I checked with the pharmacy and the PA was approved by the insurance but the medication is still going to cost him 150 for 90 day supply and he can not afford that. You may need to change the medication to something else, pt. Stated he is out of the medication now.

## 2020-01-12 ENCOUNTER — Telehealth: Payer: Self-pay | Admitting: Medical

## 2020-01-12 NOTE — Telephone Encounter (Signed)
Recv'd fax for P.A. Xultophy renewal, may have been discontinued? please advise if pt is still on this medication then I will do P.A. thanks

## 2020-01-12 NOTE — Telephone Encounter (Signed)
No on the Xultophy as we had to switch to [facility]  This make no sense though why we would get renewal when the insurance already made [place] switch???

## 2020-01-21 ENCOUNTER — Other Ambulatory Visit: Payer: PRIVATE HEALTH INSURANCE

## 2020-01-21 ENCOUNTER — Other Ambulatory Visit: Payer: Self-pay

## 2020-01-21 DIAGNOSIS — Z79899 Other long term (current) drug therapy: Secondary | ICD-10-CM

## 2020-01-21 DIAGNOSIS — B351 Tinea unguium: Secondary | ICD-10-CM

## 2020-01-21 NOTE — Telephone Encounter (Signed)
FYI the renewal generates from Cover my [employer] is the company that does the Prior authorizations and they track when the year is up for a P.A. renewals not the insurance company

## 2020-01-22 LAB — COMPREHENSIVE METABOLIC PANEL
ALT: 31 IU/L (ref 0–44)
AST: 26 IU/L (ref 0–40)
Albumin/Globulin Ratio: 1.3 (ref 1.2–2.2)
Albumin: 4.1 g/dL (ref 3.8–4.9)
Alkaline Phosphatase: 101 IU/L (ref 48–121)
BUN/Creatinine Ratio: 13 (ref 9–20)
BUN: 13 mg/dL (ref 6–24)
Bilirubin Total: 0.4 mg/dL (ref 0.0–1.2)
CO2: 29 mmol/L (ref 20–29)
Calcium: 9.2 mg/dL (ref 8.7–10.2)
Chloride: 100 mmol/L (ref 96–106)
Creatinine, Ser: 1.01 mg/dL (ref 0.76–1.27)
GFR calc Af Amer: 96 mL/min/{1.73_m2} (ref >59)
GFR calc non Af Amer: 83 mL/min/{1.73_m2} (ref >59)
Globulin, Total: 3.2 g/dL (ref 1.5–4.5)
Glucose: 156 mg/dL — ABNORMAL HIGH (ref 65–99)
Potassium: 3.6 mmol/L (ref 3.5–5.2)
Sodium: 142 mmol/L (ref 134–144)
Total Protein: 7.3 g/dL (ref 6.0–8.5)

## 2020-02-24 ENCOUNTER — Encounter (INDEPENDENT_AMBULATORY_CARE_PROVIDER_SITE_OTHER): Payer: PRIVATE HEALTH INSURANCE | Admitting: Ophthalmology

## 2020-02-26 ENCOUNTER — Encounter (INDEPENDENT_AMBULATORY_CARE_PROVIDER_SITE_OTHER): Payer: PRIVATE HEALTH INSURANCE | Admitting: Ophthalmology

## 2020-02-29 ENCOUNTER — Other Ambulatory Visit: Payer: Self-pay

## 2020-02-29 ENCOUNTER — Encounter (INDEPENDENT_AMBULATORY_CARE_PROVIDER_SITE_OTHER): Payer: PRIVATE HEALTH INSURANCE | Admitting: Ophthalmology

## 2020-02-29 DIAGNOSIS — H43813 Vitreous degeneration, bilateral: Secondary | ICD-10-CM

## 2020-02-29 DIAGNOSIS — H35033 Hypertensive retinopathy, bilateral: Secondary | ICD-10-CM

## 2020-02-29 DIAGNOSIS — H353122 Nonexudative age-related macular degeneration, left eye, intermediate dry stage: Secondary | ICD-10-CM | POA: Diagnosis not present

## 2020-02-29 DIAGNOSIS — H34831 Tributary (branch) retinal vein occlusion, right eye, with macular edema: Secondary | ICD-10-CM | POA: Diagnosis not present

## 2020-02-29 DIAGNOSIS — H35711 Central serous chorioretinopathy, right eye: Secondary | ICD-10-CM

## 2020-03-10 ENCOUNTER — Other Ambulatory Visit: Payer: Self-pay | Admitting: Medical

## 2020-04-07 ENCOUNTER — Ambulatory Visit: Payer: PRIVATE HEALTH INSURANCE | Admitting: Medical

## 2020-04-07 ENCOUNTER — Encounter: Payer: Self-pay | Admitting: Medical

## 2020-04-07 ENCOUNTER — Other Ambulatory Visit: Payer: Self-pay

## 2020-04-07 VITALS — BP 110/72 | HR 79 | Ht 72.0 in | Wt 208.2 lb

## 2020-04-07 DIAGNOSIS — M5432 Sciatica, left side: Secondary | ICD-10-CM | POA: Diagnosis not present

## 2020-04-07 DIAGNOSIS — Z23 Encounter for immunization: Secondary | ICD-10-CM | POA: Diagnosis not present

## 2020-04-07 DIAGNOSIS — M5442 Lumbago with sciatica, left side: Secondary | ICD-10-CM

## 2020-04-07 DIAGNOSIS — R21 Rash and other nonspecific skin eruption: Secondary | ICD-10-CM | POA: Diagnosis not present

## 2020-04-07 MED ORDER — METHOCARBAMOL 500 MG PO TABS: 500.0000 mg | ORAL_TABLET | Freq: Two times a day (BID) | ORAL | 0 refills | Status: DC | PRN

## 2020-04-07 MED ORDER — TRIAMCINOLONE ACETONIDE 0.1 % EX CREA
1.0000 | TOPICAL_CREAM | Freq: Two times a day (BID) | CUTANEOUS | 0 refills | Status: DC
Start: 2020-04-07 — End: 2021-05-18

## 2020-04-07 MED ORDER — NAPROXEN 500 MG PO TABS: 500.0000 mg | ORAL_TABLET | Freq: Two times a day (BID) | ORAL | 0 refills | Status: DC

## 2020-04-07 NOTE — Progress Notes (Signed)
Subjective:   Jeffrey Rodriguez is a 54 y.o. male who presents for evaluation of a rash involving the upper extremity. Rash started 2 days ago. Lesions are pink, and raised in texture. Rash has changed over time. Rash is pruritic. Associated symptoms: none. Patient denies: abdominal pain, cough, fever, irritability and myalgia. Patient has not had contacts with similar rash. Patient has had new exposures - been in attic clearing out some items.    He also notes sciatica flare up, pain in left low back and buttock.  Other than moving things out of the attic no other injury, trauma or fall.  No pain down entire leg, no numbness or tingling.  He has had this similar discomfort prior.  No fever, no weight loss, no night sweats.  No saddle anesthesia.  The following portions of the patient's history were reviewed and updated as appropriate: allergies, current medications, past family history, past medical history, past social history and problem list.  Review of Systems As in subjective above   Objective:   Gen: wd, wn, nad Skin: Left forearm anterior mid forearm length of arm, with scattered 2 mm raised faint pinkish lesions Tender left low back, mild pain with range of motion which is full, no swelling or deformity Legs nontender, normal range of motion without pain Legs neurovascularly intact   Assessment:     Encounter Diagnoses  Name Primary?  . Rash Yes  . Sciatica of left side   . Acute left-sided low back pain with left-sided sciatica   . Need for influenza vaccination      Plan:   Discussed symptoms and exam findings.  Etiology most likely contact dermatitis, likely from items he moved around in the attic.  Patient Instructions  Contact dermatitis  Begin over the counter benadryl or other allergy pill 1 or 2 times daily for the next 3-4 days.  This is to help itching, irritation, and redness  Begin Triamcinolone cream topically.   This can be used twice daily for the next 7-10  days until it resolves  Clean with soap and water  If not much improved within a week, let me know   Sciatica, low back pain  Begin Naprosyn anti-inflammatory and pain medication, twice daily with food for the next few days as needed  Also you can use Robaxin muscle relaxer, 1 tablet once or twice daily over the weekend for soreness, spasm of muscle, tightness in muscle.  This can make you sleepy, so use caution  Continue stretch regularly  Heat and massage can be helpful for this pain     Counseled on the influenza virus vaccine.  Vaccine information sheet given.  Influenza vaccine given after consent obtained.  Jeffrey Rodriguez was seen today for poison ivy.  Diagnoses and all orders for this visit:  Rash  Sciatica of left side  Acute left-sided low back pain with left-sided sciatica  Need for influenza vaccination  Other orders -     triamcinolone cream (KENALOG) 0.1 %; Apply 1 application topically 2 (two) times daily. -     naproxen (NAPROSYN) 500 MG tablet; Take 1 tablet (500 mg total) by mouth 2 (two) times daily with a meal. -     methocarbamol (ROBAXIN) 500 MG tablet; Take 1 tablet (500 mg total) by mouth 2 (two) times daily as needed for muscle spasms. -     Flu Vaccine QUAD 6+ mos PF IM (Fluarix Quad PF)    Follow up as needed

## 2020-04-07 NOTE — Patient Instructions (Addendum)
Contact dermatitis  Begin over the counter benadryl or other allergy pill 1 or 2 times daily for the next 3-4 days.  This is to help itching, irritation, and redness  Begin Triamcinolone cream topically.   This can be used twice daily for the next 7-10 days until it resolves  Clean with soap and water  If not much improved within a week, let me know   Sciatica, low back pain  Begin Naprosyn anti-inflammatory and pain medication, twice daily with food for the next few days as needed  Also you can use Robaxin muscle relaxer, 1 tablet once or twice daily over the weekend for soreness, spasm of muscle, tightness in muscle.  This can make you sleepy, so use caution  Continue stretch regularly  Heat and massage can be helpful for this pain

## 2020-05-04 ENCOUNTER — Other Ambulatory Visit: Payer: Self-pay | Admitting: Medical

## 2020-06-01 ENCOUNTER — Encounter (INDEPENDENT_AMBULATORY_CARE_PROVIDER_SITE_OTHER): Payer: PRIVATE HEALTH INSURANCE | Admitting: Ophthalmology

## 2020-06-01 ENCOUNTER — Other Ambulatory Visit: Payer: Self-pay

## 2020-06-01 DIAGNOSIS — H353122 Nonexudative age-related macular degeneration, left eye, intermediate dry stage: Secondary | ICD-10-CM

## 2020-06-01 DIAGNOSIS — H34831 Tributary (branch) retinal vein occlusion, right eye, with macular edema: Secondary | ICD-10-CM | POA: Diagnosis not present

## 2020-06-01 DIAGNOSIS — H35033 Hypertensive retinopathy, bilateral: Secondary | ICD-10-CM | POA: Diagnosis not present

## 2020-06-01 DIAGNOSIS — H43813 Vitreous degeneration, bilateral: Secondary | ICD-10-CM

## 2020-06-01 DIAGNOSIS — I10 Essential (primary) hypertension: Secondary | ICD-10-CM

## 2020-06-02 ENCOUNTER — Other Ambulatory Visit: Payer: Self-pay | Admitting: Medical

## 2020-06-02 NOTE — Telephone Encounter (Signed)
Pt called and said he needs these medications refilled along with Metformin and Asprin. He uses the [facility] on [address]. Pt has Cpe scheduled on 12/3

## 2020-06-17 ENCOUNTER — Ambulatory Visit: Payer: PRIVATE HEALTH INSURANCE | Admitting: Medical

## 2020-06-17 ENCOUNTER — Other Ambulatory Visit: Payer: Self-pay

## 2020-06-17 ENCOUNTER — Encounter: Payer: Self-pay | Admitting: Medical

## 2020-06-17 VITALS — BP 120/84 | HR 78 | Ht 72.0 in | Wt 206.2 lb

## 2020-06-17 DIAGNOSIS — I10 Essential (primary) hypertension: Secondary | ICD-10-CM

## 2020-06-17 DIAGNOSIS — E118 Type 2 diabetes mellitus with unspecified complications: Secondary | ICD-10-CM | POA: Diagnosis not present

## 2020-06-17 DIAGNOSIS — Z125 Encounter for screening for malignant neoplasm of prostate: Secondary | ICD-10-CM

## 2020-06-17 DIAGNOSIS — Z0000 Encounter for general adult medical examination without abnormal findings: Secondary | ICD-10-CM

## 2020-06-17 DIAGNOSIS — I8393 Asymptomatic varicose veins of bilateral lower extremities: Secondary | ICD-10-CM

## 2020-06-17 DIAGNOSIS — E782 Mixed hyperlipidemia: Secondary | ICD-10-CM

## 2020-06-17 DIAGNOSIS — Z79899 Other long term (current) drug therapy: Secondary | ICD-10-CM

## 2020-06-17 DIAGNOSIS — R809 Proteinuria, unspecified: Secondary | ICD-10-CM

## 2020-06-17 DIAGNOSIS — Z7185 Encounter for immunization safety counseling: Secondary | ICD-10-CM

## 2020-06-17 NOTE — Progress Notes (Signed)
Subjective:   HPI  Jeffrey Rodriguez is a 54 y.o. male who presents for Chief Complaint  Patient presents with  . Annual Exam    with fasting labs     Patient Care Team: Yevette Knust, Jeffrey Eng, PA-C as PCP - General (Family Medicine) Sees dentist Sees eye doctor, Dr. Zigmund Daniel Dr. Wilfrid Lund, GI Dr. Fransico Him, cardiology  Concerns: Here for yearly well visit  Reviewed their medical, surgical, family, social, medication, and allergy history and updated chart as appropriate.  Past Medical History:  Diagnosis Date  . Diabetes mellitus without complication ([place]) 7591  . GERD (gastroesophageal reflux disease)   . Hyperlipidemia   . Hypertension 2010  . Microalbuminuria 11/2013  . Mixed dyslipidemia    diet and exercise  . Obesity   . Smoker    0.5ppd  . Wears glasses     Past Surgical History:  Procedure Laterality Date  . COLONOSCOPY  12/2015   tubular adenoma, Dr. Wilfrid Lund  . WISDOM TOOTH EXTRACTION      Family History  Problem Relation Age of Onset  . Pneumonia Father   . Arthritis Mother   . Depression Brother   . Hypertension Other   . Cancer Neg Hx   . Stroke Neg Hx   . Colon cancer Neg Hx   . Colon polyps Neg Hx   . Esophageal cancer Neg Hx   . Rectal cancer Neg Hx   . Stomach cancer Neg Hx   . Heart disease Neg Hx   . Diabetes Neg Hx      Current Outpatient Medications:  .  aspirin (EQ ASPIRIN ADULT LOW DOSE) 81 MG EC tablet, Take 1 tablet (81 mg total) by mouth daily. Swallow whole., Disp: 90 tablet, Rfl: 3 .  atorvastatin (LIPITOR) 20 MG tablet, Take 1 tablet by mouth daily, Disp: 90 tablet, Rfl: 0 .  BD PEN NEEDLE NANO 2ND GEN 32G X 4 MM MISC, USE 1 AT BEDTIME, Disp: 100 each, Rfl: 3 .  Insulin Glargine-Lixisenatide (SOLIQUA) 100-33 UNT-MCG/ML SOPN, Inject 20 Units into the skin daily., Disp: 6 mL, Rfl: 5 .  Lancet Devices (ACCU-CHEK SOFTCLIX) lancets, Use as instructed, Disp: 1 each, Rfl: 3 .  lisinopril-hydrochlorothiazide (ZESTORETIC) 20-12.5  MG tablet, Take 1 tablet by mouth once daily, Disp: 90 tablet, Rfl: 0 .  metFORMIN (GLUCOPHAGE) 1000 MG tablet, Take 1 tablet (1,000 mg total) by mouth daily with breakfast., Disp: 180 tablet, Rfl: 0 .  methocarbamol (ROBAXIN) 500 MG tablet, Take 1 tablet (500 mg total) by mouth 2 (two) times daily as needed for muscle spasms., Disp: 15 tablet, Rfl: 0 .  naproxen (NAPROSYN) 500 MG tablet, Take 1 tablet (500 mg total) by mouth 2 (two) times daily with a meal., Disp: 30 tablet, Rfl: 0 .  omeprazole (PRILOSEC) 20 MG capsule, Take 1 capsule (20 mg total) by mouth daily as needed. Reported on 01/12/2016, Disp: 90 capsule, Rfl: 3 .  triamcinolone cream (KENALOG) 0.1 %, Apply 1 application topically 2 (two) times daily., Disp: 30 g, Rfl: 0 .  empagliflozin (JARDIANCE) 25 MG TABS tablet, Take 1 tablet (25 mg total) by mouth daily. (Patient not taking: Reported on 06/17/2020), Disp: 90 tablet, Rfl: 0 .  terbinafine (LAMISIL) 250 MG tablet, Take 1 tablet (250 mg total) by mouth daily. (Patient not taking: Reported on 04/07/2020), Disp: 45 tablet, Rfl: 0  No Known Allergies     Review of Systems Constitutional: -fever, -chills, -sweats, -unexpected weight change, -decreased appetite, -fatigue Allergy: -sneezing, -  itching, -congestion Dermatology: -changing moles, --rash, -lumps ENT: -runny nose, -ear pain, -sore throat, -hoarseness, -sinus pain, -teeth pain, - ringing in ears, -hearing loss, -nosebleeds Cardiology: -chest pain, -palpitations, -swelling, -difficulty breathing when lying flat, -waking up short of breath Respiratory: -cough, -shortness of breath, -difficulty breathing with exercise or exertion, -wheezing, -coughing up blood Gastroenterology: -abdominal pain, -nausea, -vomiting, -diarrhea, -constipation, -blood in stool, -changes in bowel movement, -difficulty swallowing or eating Hematology: -bleeding, -bruising  Musculoskeletal: -joint aches, -muscle aches, -joint swelling, -back pain, -neck  pain, -cramping, -changes in gait Ophthalmology: denies vision changes, eye redness, itching, discharge Urology: -burning with urination, -difficulty urinating, -blood in urine, -urinary frequency, -urgency, -incontinence Neurology: -headache, -weakness, -tingling, -numbness, -memory loss, -falls, -dizziness Psychology: -depressed mood, -agitation, -sleep problems Male GU: no testicular mass, pain, no lymph nodes swollen, no swelling, no rash.     Objective:  BP 120/84   Pulse 78   Ht 6' (1.829 m)   Wt 206 lb 3.2 oz (93.5 kg)   SpO2 97%   BMI 27.97 kg/m   General appearance: alert, no distress, WD/WN, African American male Skin: unremarkable HEENT: normocephalic, conjunctiva/corneas normal, sclerae anicteric, PERRLA, EOMi, nares patent, no discharge or erythema, pharynx normal Oral cavity: MMM, tongue normal, teeth normal Neck: supple, no lymphadenopathy, no thyromegaly, no masses, normal ROM, no bruits Chest: non tender, normal shape and expansion Heart: RRR, normal S1, S2, no murmurs Lungs: CTA bilaterally, no wheezes, rhonchi, or rales Abdomen: +bs, soft, non tender, non distended, no masses, no hepatomegaly, no splenomegaly, no bruits Back: non tender, normal ROM, no scoliosis Musculoskeletal: upper extremities non tender, no obvious deformity, normal ROM throughout, lower extremities non tender, no obvious deformity, normal ROM throughout Extremities: mild varicose veins, bilat, no edema, no cyanosis, no clubbing Pulses: 2+ symmetric, upper and lower extremities, normal cap refill Neurological: alert, oriented x 3, CN2-12 intact, strength normal upper extremities and lower extremities, sensation normal throughout, DTRs 2+ throughout, no cerebellar signs, gait normal Psychiatric: normal affect, behavior normal, pleasant  GU: normal male external genitalia,circumcised, nontender, no masses, no hernia, no lymphadenopathy Rectal: declines   Diabetic Foot Exam - Simple   Simple  Foot Form Diabetic Foot exam was performed with the following findings: Yes 06/17/2020 12:13 PM  Visual Inspection See comments: Yes Sensation Testing Intact to touch and monofilament testing bilaterally: Yes Pulse Check Posterior Tibialis and Dorsalis pulse intact bilaterally: Yes Comments Great toes bilaterally with some thickening, but improved from onychomycosis prior, no other lesions      Assessment and Plan :   Encounter Diagnoses  Name Primary?  . Routine general medical examination at a health care facility Yes  . Vaccine counseling   . Essential hypertension   . Diabetes mellitus with complication ([place])   . High risk medication use   . Microalbuminuria   . Mixed dyslipidemia   . Screening for prostate cancer   . Varicose veins of both lower extremities, unspecified whether complicated     Today you had a preventative care visit or wellness visit.    Topics today may have included healthy lifestyle, diet, exercise, preventative care, vaccinations, sick and well care, proper use of emergency dept and after hours care, as well as other concerns.     Recommendations: Continue to return yearly for your annual wellness and preventative care visits.  This gives [place] a chance to discuss healthy lifestyle, exercise, vaccinations, review your chart record, and perform screenings where appropriate.  I recommend you see your eye doctor  yearly for routine vision care.  I recommend you see your dentist yearly for routine dental care including hygiene visits twice yearly.   Vaccination recommendations were reviewed  Up to date on pneumococcal vaccine, flu vaccine ,tetanus booster, initial covid vaccine.  Return in 3-4 weeks to begin the 2 shot Shingrix series.     Screening for cancer: Colon cancer screening:  I reviewed your colonoscopy on file that is up to date from 2017 You were given stool cards kit to return for hemoccult screening  We discussed PSA, prostate exam,  and prostate cancer screening risks/benefits.     Skin cancer screening: Check your skin regularly for new changes, growing lesions, or other lesions of concern Come in for evaluation if you have skin lesions of concern.  Lung cancer screening: If you have a greater than 30 pack year history of tobacco use, then you qualify for lung cancer screening with a chest CT scan  We currently don't have screenings for other cancers besides breast, cervical, colon, and lung cancers.  If you have a strong family history of cancer or have other cancer screening concerns, please let me know.    Bone health: Get at least 150 minutes of aerobic exercise weekly Get weight bearing exercise at least once weekly  Heart health: Get at least 150 minutes of aerobic exercise weekly Limit alcohol It is important to maintain a healthy blood pressure and healthy cholesterol numbers Reviewed 2019 exercise stress test which was reassuring   Separate significant issues discussed: Hypertension-continue current medication  Hyperlipidemia-continue current medication  Diabetes-continue current medication.  Never started Jardiance due to cost but recent A1c was at goal.  Continue Soliqua and Metformin  Advise smoking cessation  I reviewed his labs from recent biometric screen at work.  HgbA1c 6.2% on 05/31/20; lipids on 05/31/20 showing LDL42, TRIG 94, total chol <100, HDL 38.     Kitt was seen today for annual exam.  Diagnoses and all orders for this visit:  Routine general medical examination at a health care facility -     Comprehensive metabolic panel -     Microalbumin/Creatinine Ratio, Urine -     CBC -     PSA  Vaccine counseling  Essential hypertension  Diabetes mellitus with complication ([facility]) -     Microalbumin/Creatinine Ratio, Urine  High risk medication use  Microalbuminuria -     Microalbumin/Creatinine Ratio, Urine  Mixed dyslipidemia  Screening for prostate cancer -      PSA  Varicose veins of both lower extremities, unspecified whether complicated     Follow-up pending labs, yearly for physical

## 2020-06-18 ENCOUNTER — Other Ambulatory Visit: Payer: Self-pay | Admitting: Medical

## 2020-06-18 LAB — CBC
Hematocrit: 41.8 % (ref 37.5–51.0)
Hemoglobin: 14.5 g/dL (ref 13.0–17.7)
MCH: 30 pg (ref 26.6–33.0)
MCHC: 34.7 g/dL (ref 31.5–35.7)
MCV: 87 fL (ref 79–97)
Platelets: 237 10*3/uL (ref 150–450)
RBC: 4.83 x10E6/uL (ref 4.14–5.80)
RDW: 13 % (ref 11.6–15.4)
WBC: 5.5 10*3/uL (ref 3.4–10.8)

## 2020-06-18 LAB — COMPREHENSIVE METABOLIC PANEL
ALT: 20 IU/L (ref 0–44)
AST: 23 IU/L (ref 0–40)
Albumin/Globulin Ratio: 1.6 (ref 1.2–2.2)
Albumin: 4.6 g/dL (ref 3.8–4.9)
Alkaline Phosphatase: 84 IU/L (ref 44–121)
BUN/Creatinine Ratio: 12 (ref 9–20)
BUN: 12 mg/dL (ref 6–24)
Bilirubin Total: 0.5 mg/dL (ref 0.0–1.2)
CO2: 28 mmol/L (ref 20–29)
Calcium: 9.7 mg/dL (ref 8.7–10.2)
Chloride: 100 mmol/L (ref 96–106)
Creatinine, Ser: 0.97 mg/dL (ref 0.76–1.27)
GFR calc Af Amer: 100 mL/min/{1.73_m2} (ref >59)
GFR calc non Af Amer: 87 mL/min/{1.73_m2} (ref >59)
Globulin, Total: 2.9 g/dL (ref 1.5–4.5)
Glucose: 101 mg/dL — ABNORMAL HIGH (ref 65–99)
Potassium: 4.2 mmol/L (ref 3.5–5.2)
Sodium: 139 mmol/L (ref 134–144)
Total Protein: 7.5 g/dL (ref 6.0–8.5)

## 2020-06-18 LAB — MICROALBUMIN / CREATININE URINE RATIO
Creatinine, Urine: 157.9 mg/dL
Microalb/Creat Ratio: 11 mg/g creat (ref 0–29)
Microalbumin, Urine: 16.6 ug/mL

## 2020-06-18 LAB — PSA: Prostate Specific Ag, Serum: 1.4 ng/mL (ref 0.0–4.0)

## 2020-06-18 MED ORDER — LISINOPRIL-HYDROCHLOROTHIAZIDE 20-12.5 MG PO TABS: 1.0000 | ORAL_TABLET | Freq: Every day | ORAL | 3 refills | Status: DC

## 2020-06-18 MED ORDER — SOLIQUA 100-33 UNT-MCG/ML ~~LOC~~ SOPN: 20.0000 [IU] | PEN_INJECTOR | Freq: Every day | SUBCUTANEOUS | 5 refills | Status: DC

## 2020-06-18 MED ORDER — ASPIRIN 81 MG PO TBEC: 81.0000 mg | DELAYED_RELEASE_TABLET | Freq: Every day | ORAL | 3 refills | Status: DC

## 2020-06-18 MED ORDER — METFORMIN HCL 1000 MG PO TABS: 1000.0000 mg | ORAL_TABLET | Freq: Two times a day (BID) | ORAL | 3 refills | Status: DC

## 2020-06-18 MED ORDER — ATORVASTATIN CALCIUM 20 MG PO TABS: 20.0000 mg | ORAL_TABLET | Freq: Every day | ORAL | 3 refills | Status: DC

## 2020-06-18 MED ORDER — OMEPRAZOLE 20 MG PO CPDR: 20.0000 mg | DELAYED_RELEASE_CAPSULE | Freq: Every day | ORAL | 3 refills | Status: DC | PRN

## 2020-06-18 MED ORDER — BLOOD GLUCOSE MONITOR KIT: 1.0000 | PACK | 3 refills | Status: AC

## 2020-09-01 ENCOUNTER — Encounter (INDEPENDENT_AMBULATORY_CARE_PROVIDER_SITE_OTHER): Payer: PRIVATE HEALTH INSURANCE | Admitting: Ophthalmology

## 2020-09-08 ENCOUNTER — Encounter (INDEPENDENT_AMBULATORY_CARE_PROVIDER_SITE_OTHER): Payer: PRIVATE HEALTH INSURANCE | Admitting: Ophthalmology

## 2020-09-08 ENCOUNTER — Other Ambulatory Visit: Payer: Self-pay

## 2020-09-08 DIAGNOSIS — I10 Essential (primary) hypertension: Secondary | ICD-10-CM

## 2020-09-08 DIAGNOSIS — H35033 Hypertensive retinopathy, bilateral: Secondary | ICD-10-CM | POA: Diagnosis not present

## 2020-09-08 DIAGNOSIS — H348312 Tributary (branch) retinal vein occlusion, right eye, stable: Secondary | ICD-10-CM

## 2020-09-08 DIAGNOSIS — H353122 Nonexudative age-related macular degeneration, left eye, intermediate dry stage: Secondary | ICD-10-CM

## 2020-09-08 DIAGNOSIS — H35711 Central serous chorioretinopathy, right eye: Secondary | ICD-10-CM

## 2020-09-08 DIAGNOSIS — H43813 Vitreous degeneration, bilateral: Secondary | ICD-10-CM

## 2020-10-26 ENCOUNTER — Other Ambulatory Visit: Payer: Self-pay

## 2020-10-26 ENCOUNTER — Other Ambulatory Visit (INDEPENDENT_AMBULATORY_CARE_PROVIDER_SITE_OTHER): Payer: PRIVATE HEALTH INSURANCE

## 2020-10-26 DIAGNOSIS — Z23 Encounter for immunization: Secondary | ICD-10-CM | POA: Diagnosis not present

## 2020-10-27 ENCOUNTER — Other Ambulatory Visit: Payer: PRIVATE HEALTH INSURANCE

## 2020-12-15 ENCOUNTER — Encounter (INDEPENDENT_AMBULATORY_CARE_PROVIDER_SITE_OTHER): Payer: PRIVATE HEALTH INSURANCE | Admitting: Ophthalmology

## 2020-12-15 ENCOUNTER — Other Ambulatory Visit: Payer: Self-pay

## 2020-12-15 DIAGNOSIS — H35711 Central serous chorioretinopathy, right eye: Secondary | ICD-10-CM | POA: Diagnosis not present

## 2020-12-15 DIAGNOSIS — I10 Essential (primary) hypertension: Secondary | ICD-10-CM

## 2020-12-15 DIAGNOSIS — H353122 Nonexudative age-related macular degeneration, left eye, intermediate dry stage: Secondary | ICD-10-CM

## 2020-12-15 DIAGNOSIS — H34831 Tributary (branch) retinal vein occlusion, right eye, with macular edema: Secondary | ICD-10-CM | POA: Diagnosis not present

## 2020-12-15 DIAGNOSIS — H35033 Hypertensive retinopathy, bilateral: Secondary | ICD-10-CM

## 2020-12-15 DIAGNOSIS — H43813 Vitreous degeneration, bilateral: Secondary | ICD-10-CM

## 2020-12-29 ENCOUNTER — Other Ambulatory Visit: Payer: Self-pay

## 2020-12-29 ENCOUNTER — Other Ambulatory Visit (INDEPENDENT_AMBULATORY_CARE_PROVIDER_SITE_OTHER): Payer: PRIVATE HEALTH INSURANCE

## 2020-12-29 DIAGNOSIS — Z23 Encounter for immunization: Secondary | ICD-10-CM | POA: Diagnosis not present

## 2021-01-12 LAB — HM DIABETES EYE EXAM

## 2021-01-18 ENCOUNTER — Other Ambulatory Visit: Payer: Self-pay

## 2021-01-18 ENCOUNTER — Encounter: Payer: Self-pay | Admitting: Medical

## 2021-01-18 ENCOUNTER — Ambulatory Visit: Payer: PRIVATE HEALTH INSURANCE | Admitting: Medical

## 2021-01-18 ENCOUNTER — Ambulatory Visit
Admission: RE | Admit: 2021-01-18 | Discharge: 2021-01-18 | Disposition: A | Discharge status: Home / Self Care | Payer: PRIVATE HEALTH INSURANCE | Source: Ambulatory Visit | Attending: Medical | Admitting: Medical

## 2021-01-18 VITALS — BP 124/82 | HR 64 | Temp 98.9°F | Wt 212.6 lb

## 2021-01-18 DIAGNOSIS — M5431 Sciatica, right side: Secondary | ICD-10-CM

## 2021-01-18 DIAGNOSIS — G8929 Other chronic pain: Secondary | ICD-10-CM | POA: Insufficient documentation

## 2021-01-18 DIAGNOSIS — M5441 Lumbago with sciatica, right side: Secondary | ICD-10-CM

## 2021-01-18 DIAGNOSIS — M25551 Pain in right hip: Secondary | ICD-10-CM | POA: Diagnosis not present

## 2021-01-18 MED ORDER — NAPROXEN 500 MG PO TABS: 500.0000 mg | ORAL_TABLET | Freq: Two times a day (BID) | ORAL | 0 refills | Status: DC

## 2021-01-18 NOTE — Progress Notes (Signed)
Subjective:  Nichols Corter is a 55 y.o. male who presents for Chief Complaint  Patient presents with   other    Pain in hips in tail bone last a week or so but comes and goes     Here for pains in tail bone and hips.  He notes "sciatica" that flares up every now and then, worse in last few days.  He points to pain mostly in right hip and buttock.  Pain radiates to back of thigh and groin at times, but mostly right hip area.  No recent fall or injury.  At times hurts to walk. Has stairs at home, but not a lot of stair use.  Using extra strength tylenol sometimes.  Does some walking , some stretching.  No incontinence of bowels or bladder, no blood in urine or stool.  Sometimes pains in left hip, but more often right hip.  No other aggravating or relieving factors.    No other c/o.  Past Medical History:  Diagnosis Date   Diabetes mellitus without complication ([place]) 6063   GERD (gastroesophageal reflux disease)    Hyperlipidemia    Hypertension 2010   Microalbuminuria 11/2013   Mixed dyslipidemia    diet and exercise   Obesity    Smoker    0.5ppd   Wears glasses    Current Outpatient Medications on File Prior to Visit  Medication Sig Dispense Refill   aspirin (EQ ASPIRIN ADULT LOW DOSE) 81 MG EC tablet Take 1 tablet (81 mg total) by mouth daily. Swallow whole. 90 tablet 3   atorvastatin (LIPITOR) 20 MG tablet Take 1 tablet (20 mg total) by mouth daily. 90 tablet 3   BD PEN NEEDLE NANO 2ND GEN 32G X 4 MM MISC USE 1 AT BEDTIME 100 each 3   blood glucose meter kit and supplies KIT Inject 1 each into the skin as directed. Dispense based on patient and insurance preference. Use up to four times daily as directed 100 each 3   Insulin Glargine-Lixisenatide (SOLIQUA) 100-33 UNT-MCG/ML SOPN Inject 20 Units into the skin daily. 6 mL 5   lisinopril-hydrochlorothiazide (ZESTORETIC) 20-12.5 MG tablet Take 1 tablet by mouth daily. 90 tablet 3   metFORMIN (GLUCOPHAGE) 1000 MG tablet Take 1 tablet  (1,000 mg total) by mouth 2 (two) times daily with a meal. 180 tablet 3   omeprazole (PRILOSEC) 20 MG capsule Take 1 capsule (20 mg total) by mouth daily as needed. Reported on 01/12/2016 90 capsule 3   triamcinolone cream (KENALOG) 0.1 % Apply 1 application topically 2 (two) times daily. 30 g 0   No current facility-administered medications on file prior to visit.     The following portions of the patient's history were reviewed and updated as appropriate: allergies, current medications, past family history, past medical history, past social history, past surgical history and problem list.  ROS Otherwise as in subjective above  Objective: BP 124/82   Pulse 64   Temp 98.9 F (37.2 C)   Wt 212 lb 9.6 oz (96.4 kg)   BMI 28.83 kg/m   General appearance: alert, no distress, well developed, well nourished Back nontender, no deformity, normal range of motion, full range of motion Tender over right buttock and sciatic notch, otherwise hips and legs nontender to palpation, no bursa tenderness, relatively full range of motion of bilateral hips, otherwise legs nontender normal range of motion, seems to be in mild discomfort walking in reference to the right hip and buttock Leg strength normal, DTRs  1+ blunted bilaterally of legs, strength normal, normal heel and toe walk Pulses: 2+ radial pulses, 2+ pedal pulses, normal cap refill Ext: no edema   Reviewed prior pelvis x-ray 2017 that was normal    Assessment: Encounter Diagnoses  Name Primary?   Sciatica of right side Yes   Chronic right-sided low back pain with right-sided sciatica    Right hip pain      Plan: Symptoms and exam suggest more of a sciatica issue but cannot completely rule out arthritis of the SI joint or hip  Go for x-rays of lumbar spine and right hip  Please go to [facility] for your lumbar spine and right hip xrays.   Their hours are 8am - 4:30 pm Monday - Friday.  Take your insurance card with  you.  [facility] [phone]  [address], [place], [place] [address]  [address],  [place] [address]   Recommendations: I recommend you back hip and leg stretches regularly Avoid heavy impact exercise such as running on hard pavement Do exercise including walking, elliptical, stationary bike, and less impactful exercise on hard pavement When you are having pain you can either use Tylenol over-the-counter which would be safer or occasional use of the Naprosyn I refilled today We want to limit Naprosyn given the potential damage to the kidney with overuse If your x-ray shows significant arthritis, we may consider referral to orthopedics  If worsening pain, fever, incontinence, deep bone pain, numbness tingling or weakness of the lower extremity then recheck  [facility] was seen today for other.  Diagnoses and all orders for this visit:  Sciatica of right side -     DG HIP UNILAT WITH PELVIS 2-3 VIEWS RIGHT; Future -     DG Lumbar Spine Complete; Future  Chronic right-sided low back pain with right-sided sciatica -     DG HIP UNILAT WITH PELVIS 2-3 VIEWS RIGHT; Future -     DG Lumbar Spine Complete; Future  Right hip pain -     DG HIP UNILAT WITH PELVIS 2-3 VIEWS RIGHT; Future -     DG Lumbar Spine Complete; Future  Other orders -     naproxen (NAPROSYN) 500 MG tablet; Take 1 tablet (500 mg total) by mouth 2 (two) times daily with a meal.   Follow up: pending xrays

## 2021-01-18 NOTE — Patient Instructions (Signed)
Symptoms and exam suggest more of a sciatica issue but cannot completely rule out arthritis of the SI joint or hip  Go for x-rays of lumbar spine and right hip  Please go to [facility] for your lumbar spine and right hip xrays.   Their hours are 8am - 4:30 pm Monday - Friday.  Take your insurance card with you.  [facility] [phone]  [place], [place], [place] [address]  [address], [place] [address]   Recommendations: I recommend you back hip and leg stretches regularly Avoid heavy impact exercise such as running on hard pavement Do exercise including walking, elliptical, stationary bike, and less impactful exercise on hard pavement When you are having pain you can either use Tylenol over-the-counter which would be safer or occasional use of the Naprosyn I refilled today We want to limit Naprosyn given the potential damage to the kidney with overuse If your x-ray shows significant arthritis, we may consider referral to orthopedics  If worsening pain, fever, incontinence, deep bone pain, numbness tingling or weakness of the lower extremity then recheck

## 2021-03-11 ENCOUNTER — Other Ambulatory Visit: Payer: Self-pay | Admitting: Medical

## 2021-03-17 ENCOUNTER — Telehealth: Payer: Self-pay | Admitting: Internal Medicine

## 2021-03-17 NOTE — Telephone Encounter (Signed)
Left message for pt to call back to schedule appt for diabetes and not wait until December for annual wellness

## 2021-03-22 ENCOUNTER — Encounter (HOSPITAL_COMMUNITY): Payer: Self-pay | Admitting: Emergency Medicine

## 2021-03-22 ENCOUNTER — Ambulatory Visit (HOSPITAL_COMMUNITY)
Admission: EM | Admit: 2021-03-22 | Discharge: 2021-03-22 | Disposition: A | Discharge status: Home / Self Care | Payer: PRIVATE HEALTH INSURANCE | Attending: Emergency Medicine | Admitting: Emergency Medicine

## 2021-03-22 DIAGNOSIS — B349 Viral infection, unspecified: Secondary | ICD-10-CM

## 2021-03-22 DIAGNOSIS — Z20822 Contact with and (suspected) exposure to covid-19: Secondary | ICD-10-CM | POA: Diagnosis present

## 2021-03-22 NOTE — Discharge Instructions (Addendum)
Go home and quarantine, most likely you have COVID since +family member(wife is positive). Rest,push fluids, may take OTC meds for s/s management. follow up with PCP. Quarantine for 5 days per [employer] guidelines. Go to Er if you develop shortness of breath, or chest pain.

## 2021-03-22 NOTE — ED Triage Notes (Signed)
Pt presents with cough, fever, bodyaches and chills Xs 2 days. Pts wife tested positive for COVID yesterday.

## 2021-03-22 NOTE — ED Provider Notes (Signed)
[facility]    CSN: [id number] Arrival date & time: 03/22/21  1248      History   Chief Complaint Chief Complaint  Patient presents with   Cough   Fever   Generalized Body Aches   Weakness    HPI Jeffrey Rodriguez is a 55 y.o. male.   55 yr old male pt, [facility], presents to Urgent care cc of fever, cough, chills, bodyaches x 2 days. Wife tested positive for covid yesterday per pt report. No treatment tried. Pt has been covid vaccinated and boosted  The history is provided by the patient. No language interpreter was used.   Past Medical History:  Diagnosis Date   Diabetes mellitus without complication ([place]) 1324   GERD (gastroesophageal reflux disease)    Hyperlipidemia    Hypertension 2010   Microalbuminuria 11/2013   Mixed dyslipidemia    diet and exercise   Obesity    Smoker    0.5ppd   Wears glasses     Patient Active Problem List   Diagnosis Date Noted   Contact with and (suspected) exposure to covid-19 03/22/2021   Viral illness 03/22/2021   Right hip pain 01/18/2021   Chronic right-sided low back pain with right-sided sciatica 01/18/2021   Screening for prostate cancer 06/17/2020   Varicose veins of both lower extremities 06/17/2020   Sciatica of left side 04/07/2020   Acute left-sided low back pain with left-sided sciatica 04/07/2020   High risk medication use 12/10/2019   Sciatica of right side 01/30/2019   Mixed dyslipidemia 02/19/2017   Vaccine counseling 02/19/2017   Onychomycosis 07/26/2016   Need for influenza vaccination 04/12/2016   Microalbuminuria 01/12/2016   Routine general medical examination at a health care facility 10/05/2015   Smoker 10/05/2015   Essential hypertension 10/05/2015   Diabetes mellitus with complication ([place]) 40/04/2724    Past Surgical History:  Procedure Laterality Date   COLONOSCOPY  12/2015   tubular adenoma, Dr. Wilfrid Lund   WISDOM TOOTH EXTRACTION         Home Medications    Prior to  Admission medications   Medication Sig Start Date End Date Taking? Authorizing Provider  aspirin (EQ ASPIRIN ADULT LOW DOSE) 81 MG EC tablet Take 1 tablet (81 mg total) by mouth daily. Swallow whole. 06/18/20   Tysinger, Camelia Eng, PA-C  atorvastatin (LIPITOR) 20 MG tablet Take 1 tablet (20 mg total) by mouth daily. 06/18/20   Tysinger, Camelia Eng, PA-C  BD PEN NEEDLE NANO 2ND GEN 32G X 4 MM MISC USE 1  AT BEDTIME 03/13/21   Tysinger, Camelia Eng, PA-C  blood glucose meter kit and supplies KIT Inject 1 each into the skin as directed. Dispense based on patient and insurance preference. Use up to four times daily as directed 06/18/20   Tysinger, Camelia Eng, PA-C  Insulin Glargine-Lixisenatide (SOLIQUA) 100-33 UNT-MCG/ML SOPN Inject 20 Units into the skin daily. 06/18/20   Tysinger, Camelia Eng, PA-C  lisinopril-hydrochlorothiazide (ZESTORETIC) 20-12.5 MG tablet Take 1 tablet by mouth daily. 06/18/20   Tysinger, Camelia Eng, PA-C  metFORMIN (GLUCOPHAGE) 1000 MG tablet Take 1 tablet (1,000 mg total) by mouth 2 (two) times daily with a meal. 06/18/20 06/18/21  Tysinger, Camelia Eng, PA-C  naproxen (NAPROSYN) 500 MG tablet Take 1 tablet (500 mg total) by mouth 2 (two) times daily with a meal. 01/18/21   Tysinger, Camelia Eng, PA-C  omeprazole (PRILOSEC) 20 MG capsule Take 1 capsule (20 mg total) by mouth daily as needed. Reported  on 01/12/2016 06/18/20   Tysinger, Camelia Eng, PA-C  triamcinolone cream (KENALOG) 0.1 % Apply 1 application topically 2 (two) times daily. 04/07/20   Tysinger, Camelia Eng, PA-C    Family History Family History  Problem Relation Age of Onset   Pneumonia Father    Arthritis Mother    Depression Brother    Hypertension Other    Cancer Neg Hx    Stroke Neg Hx    Colon cancer Neg Hx    Colon polyps Neg Hx    Esophageal cancer Neg Hx    Rectal cancer Neg Hx    Stomach cancer Neg Hx    Heart disease Neg Hx    Diabetes Neg Hx     Social History Social History   Tobacco Use   Smoking status: Every Day     Packs/day: 0.50    Years: 26.00    Pack years: 13.00    Types: Cigarettes   Smokeless tobacco: Never  Vaping Use   Vaping Use: Never used  Substance Use Topics   Alcohol use: No    Alcohol/week: 0.0 standard drinks   Drug use: No     Allergies   Patient has no known allergies.   Review of Systems Review of Systems  Constitutional:  Positive for chills and fever.  HENT:  Positive for congestion.   Respiratory:  Positive for cough. Negative for wheezing.   Genitourinary:  Negative for dysuria.  Musculoskeletal:  Positive for myalgias.  All other systems reviewed and are negative.   Physical Exam Triage Vital Signs ED Triage Vitals [03/22/21 1411]  Enc Vitals Group     BP      Pulse      Resp      Temp      Temp src      SpO2      Weight      Height      Head Circumference      Peak Flow      Pain Score 3     Pain Loc      Pain Edu?      Excl. in [place]?    No data found.  Updated Vital Signs BP 105/71 (BP Location: Right Arm)   Pulse 79   Temp 99 F (37.2 C) (Oral)   Resp 16   SpO2 99%   Visual Acuity Right Eye Distance:   Left Eye Distance:   Bilateral Distance:    Right Eye Near:   Left Eye Near:    Bilateral Near:     Physical Exam Vitals and nursing note reviewed.  Constitutional:      General: He is not in acute distress.    Appearance: He is well-developed. He is not ill-appearing or toxic-appearing.  HENT:     Head: Normocephalic.     Right Ear: Tympanic membrane is retracted.     Left Ear: Tympanic membrane is retracted.     Nose: Mucosal edema and congestion present.     Mouth/Throat:     Lips: Pink.     Mouth: Mucous membranes are moist.     Pharynx: Oropharynx is clear. Uvula midline.  Eyes:     General: Lids are normal.     Conjunctiva/sclera: Conjunctivae normal.     Pupils: Pupils are equal, round, and reactive to light.  Cardiovascular:     Rate and Rhythm: Normal rate and regular rhythm.     Pulses: Normal pulses.      Heart  sounds: Normal heart sounds.  Pulmonary:     Effort: Pulmonary effort is normal. No respiratory distress.     Breath sounds: Normal breath sounds and air entry. No decreased breath sounds or wheezing.  Abdominal:     General: There is no distension.     Palpations: Abdomen is soft.  Musculoskeletal:        General: Normal range of motion.     Cervical back: Full passive range of motion without pain and normal range of motion.  Skin:    General: Skin is warm and dry.     Findings: No rash.  Neurological:     General: No focal deficit present.     Mental Status: He is alert and oriented to person, place, and time.     GCS: GCS eye subscore is 4. GCS verbal subscore is 5. GCS motor subscore is 6.     Cranial Nerves: No cranial nerve deficit.     Sensory: No sensory deficit.  Psychiatric:        Attention and Perception: Attention normal.        Mood and Affect: Mood normal.        Speech: Speech normal.        Behavior: Behavior normal. Behavior is cooperative.     UC Treatments / Results  Labs (all labs ordered are listed, but only abnormal results are displayed) Labs Reviewed  SARS CORONAVIRUS 2 (TAT 6-24 HRS)    EKG   Radiology No results found.  Procedures Procedures (including critical care time)  Medications Ordered in UC Medications - No data to display  Initial Impression / Assessment and Plan / UC Course  I have reviewed the triage vital signs and the nursing notes.  Pertinent labs & imaging results that were available during my care of the patient were reviewed by me and considered in my medical decision making (see chart for details).     Ddx: Covid, Viral illness Final Clinical Impressions(s) / UC Diagnoses   Final diagnoses:  Contact with and (suspected) exposure to covid-19  Viral illness     Discharge Instructions      Go home and quarantine, most likely you have COVID since +family member(wife is positive). Rest,push fluids, may take  OTC meds for s/s management. follow up with PCP. Quarantine for 5 days per [employer] guidelines. Go to Er if you develop shortness of breath, or chest pain.     ED Prescriptions   None    PDMP not reviewed this encounter.   Tori Milks, NP 63/01/60 1525

## 2021-03-23 ENCOUNTER — Encounter (INDEPENDENT_AMBULATORY_CARE_PROVIDER_SITE_OTHER): Payer: PRIVATE HEALTH INSURANCE | Admitting: Ophthalmology

## 2021-03-23 LAB — SARS CORONAVIRUS 2 (TAT 6-24 HRS): SARS Coronavirus 2: POSITIVE — AB

## 2021-04-06 ENCOUNTER — Ambulatory Visit: Payer: PRIVATE HEALTH INSURANCE | Admitting: Medical

## 2021-04-06 ENCOUNTER — Other Ambulatory Visit: Payer: Self-pay

## 2021-04-06 VITALS — BP 118/70 | HR 88 | Wt 210.8 lb

## 2021-04-06 DIAGNOSIS — Z23 Encounter for immunization: Secondary | ICD-10-CM | POA: Insufficient documentation

## 2021-04-06 DIAGNOSIS — E118 Type 2 diabetes mellitus with unspecified complications: Secondary | ICD-10-CM | POA: Diagnosis not present

## 2021-04-06 DIAGNOSIS — K635 Polyp of colon: Secondary | ICD-10-CM

## 2021-04-06 DIAGNOSIS — Z1211 Encounter for screening for malignant neoplasm of colon: Secondary | ICD-10-CM

## 2021-04-06 DIAGNOSIS — B353 Tinea pedis: Secondary | ICD-10-CM | POA: Insufficient documentation

## 2021-04-06 DIAGNOSIS — I10 Essential (primary) hypertension: Secondary | ICD-10-CM

## 2021-04-06 DIAGNOSIS — B351 Tinea unguium: Secondary | ICD-10-CM

## 2021-04-06 DIAGNOSIS — E782 Mixed hyperlipidemia: Secondary | ICD-10-CM | POA: Diagnosis not present

## 2021-04-06 LAB — POCT GLYCOSYLATED HEMOGLOBIN (HGB A1C): Hemoglobin A1C: 7.1 % — AB (ref 4.0–5.6)

## 2021-04-06 MED ORDER — TERBINAFINE HCL 1 % EX CREA: 1.0000 "application " | TOPICAL_CREAM | Freq: Two times a day (BID) | CUTANEOUS | 0 refills | Status: DC

## 2021-04-06 MED ORDER — SOLIQUA 100-33 UNT-MCG/ML ~~LOC~~ SOPN: 20.0000 [IU] | PEN_INJECTOR | Freq: Every day | SUBCUTANEOUS | 5 refills | Status: DC

## 2021-04-06 MED ORDER — CETIRIZINE HCL 10 MG PO TABS: 10.0000 mg | ORAL_TABLET | Freq: Every day | ORAL | 1 refills | Status: DC

## 2021-04-06 NOTE — Patient Instructions (Signed)
Check insurance about coverage for these diabetes medicaiton:  Ozempic Rybelsus Lovie Macadamia

## 2021-04-06 NOTE — Progress Notes (Signed)
Subjective:  Jeffrey Rodriguez is a 55 y.o. male who presents for Chief Complaint  Patient presents with   diabetes follow-up    Diabetes follow-up, flu shot given     Diabetes-blood sugars are running normal less than 120 fasting.  Compliant with Soliqua daily as well as metformin.  He is taking metformin once a day not twice a day as listed initially.  No foot concerns.  Sees eye doctor regularly  Hypertension-compliant medication without complaint.  No chest pain, palpitations, edema.  Hyperlipidemia-compliant with statin without complaint.  He is nonfasting for labs  He has some ongoing sinus congestion.  Using nothing for symptoms.  This is been on for months  He would like a flu shot today  No other aggravating or relieving factors.    No other c/o.  The following portions of the patient's history were reviewed and updated as appropriate: allergies, current medications, past family history, past medical history, past social history, past surgical history and problem list.  ROS Otherwise as in subjective above  Objective: BP 118/70   Pulse 88   Wt 210 lb 12.8 oz (95.6 kg)   BMI 28.59 kg/m   General appearance: alert, no distress, well developed, well nourished Neck: supple, no lymphadenopathy, no thyromegaly, no masses Heart: RRR, normal S1, S2, no murmurs Lungs: CTA bilaterally, no wheezes, rhonchi, or rales Abdomen: +bs, soft, non tender, non distended, no masses, no hepatomegaly, no splenomegaly Pulses: 2+ radial pulses, 2+ pedal pulses, normal cap refill Ext: no edema  Diabetic Foot Exam - Simple   Simple Foot Form Diabetic Foot exam was performed with the following findings: Yes 04/06/2021 12:09 PM  Visual Inspection See comments: Yes Sensation Testing Intact to touch and monofilament testing bilaterally: Yes Pulse Check Posterior Tibialis and Dorsalis pulse intact bilaterally: Yes Comments Macerated whitish coloration between several toes bilaterally,  thickened great toenails with discoloration bilaterally, several toenails are not cut straight      Assessment: Encounter Diagnoses  Name Primary?   Diabetes mellitus with complication (Jeffrey Rodriguez) Yes   Essential hypertension    Needs flu shot    Mixed dyslipidemia    Screen for colon cancer    Polyp of colon, unspecified part of colon, unspecified type    Tinea pedis of both feet    Onychomycosis      Plan: Diabetes  He will check insurance coverage for Rybelsus, Ozempic, Farxiga, Jardiance to give [place] options  for now continue [name] and metformin once daily.  Hemoglobin A1c 7.1 today   hypertension-continue current medication   hyperlipidemia-continue current medication.  Return soon for fasting lab   referrals today for podiatry for foot issues, toenail issues, begin Lamisil cream in between toes for the next 3 weeks   referral back to gastroenterology as he is due for colonoscopy  Counseled on the influenza virus vaccine.  Vaccine information sheet given.  Influenza vaccine given after consent obtained.    Jeffrey Rodriguez was seen today for diabetes follow-up.  Diagnoses and all orders for this visit:  Diabetes mellitus with complication ([place]) -     HgB A1c -     Comprehensive metabolic panel -     Lipid panel; Future  Essential hypertension -     Comprehensive metabolic panel  Needs flu shot -     Flu Vaccine QUAD 10mo+IM (Fluarix, Fluzone & Alfiuria Quad PF)  Mixed dyslipidemia -     Lipid panel; Future  Screen for colon cancer -     Ambulatory  referral to Gastroenterology  Polyp of colon, unspecified part of colon, unspecified type -     Ambulatory referral to Gastroenterology  Tinea pedis of both feet -     Ambulatory referral to Podiatry  Onychomycosis -     Ambulatory referral to Podiatry  Other orders -     terbinafine (LAMISIL AT) 1 % cream; Apply 1 application topically 2 (two) times daily. -     cetirizine (ZYRTEC) 10 MG tablet; Take 1 tablet (10 mg  total) by mouth at bedtime. -     Insulin Glargine-Lixisenatide (SOLIQUA) 100-33 UNT-MCG/ML SOPN; Inject 20 Units into the skin daily.   Follow up: pending referrals, labs

## 2021-04-20 ENCOUNTER — Ambulatory Visit: Payer: PRIVATE HEALTH INSURANCE | Admitting: Podiatry

## 2021-04-27 ENCOUNTER — Ambulatory Visit: Payer: PRIVATE HEALTH INSURANCE | Admitting: Podiatry

## 2021-04-27 ENCOUNTER — Encounter: Payer: Self-pay | Admitting: Gastroenterology

## 2021-04-28 ENCOUNTER — Other Ambulatory Visit: Payer: Self-pay

## 2021-04-28 ENCOUNTER — Encounter (INDEPENDENT_AMBULATORY_CARE_PROVIDER_SITE_OTHER): Payer: PRIVATE HEALTH INSURANCE | Admitting: Ophthalmology

## 2021-04-28 ENCOUNTER — Other Ambulatory Visit: Payer: PRIVATE HEALTH INSURANCE

## 2021-04-28 DIAGNOSIS — E118 Type 2 diabetes mellitus with unspecified complications: Secondary | ICD-10-CM

## 2021-04-28 DIAGNOSIS — H35033 Hypertensive retinopathy, bilateral: Secondary | ICD-10-CM | POA: Diagnosis not present

## 2021-04-28 DIAGNOSIS — H353122 Nonexudative age-related macular degeneration, left eye, intermediate dry stage: Secondary | ICD-10-CM

## 2021-04-28 DIAGNOSIS — H35711 Central serous chorioretinopathy, right eye: Secondary | ICD-10-CM

## 2021-04-28 DIAGNOSIS — H34831 Tributary (branch) retinal vein occlusion, right eye, with macular edema: Secondary | ICD-10-CM

## 2021-04-28 DIAGNOSIS — E782 Mixed hyperlipidemia: Secondary | ICD-10-CM

## 2021-04-28 DIAGNOSIS — H2513 Age-related nuclear cataract, bilateral: Secondary | ICD-10-CM

## 2021-04-28 DIAGNOSIS — I10 Essential (primary) hypertension: Secondary | ICD-10-CM

## 2021-04-28 DIAGNOSIS — H43813 Vitreous degeneration, bilateral: Secondary | ICD-10-CM

## 2021-04-29 LAB — LIPID PANEL
Chol/HDL Ratio: 2.4 ratio (ref 0.0–5.0)
Cholesterol, Total: 84 mg/dL — ABNORMAL LOW (ref 100–199)
HDL: 35 mg/dL — ABNORMAL LOW (ref >39)
LDL Chol Calc (NIH): 32 mg/dL (ref 0–99)
Triglycerides: 82 mg/dL (ref 0–149)
VLDL Cholesterol Cal: 17 mg/dL (ref 5–40)

## 2021-05-18 ENCOUNTER — Other Ambulatory Visit: Payer: Self-pay

## 2021-05-18 ENCOUNTER — Ambulatory Visit (INDEPENDENT_AMBULATORY_CARE_PROVIDER_SITE_OTHER): Payer: PRIVATE HEALTH INSURANCE | Admitting: Podiatry

## 2021-05-18 DIAGNOSIS — B351 Tinea unguium: Secondary | ICD-10-CM

## 2021-05-18 DIAGNOSIS — B353 Tinea pedis: Secondary | ICD-10-CM | POA: Diagnosis not present

## 2021-05-18 MED ORDER — KETOCONAZOLE 2 % EX CREA: 1.0000 "application " | TOPICAL_CREAM | Freq: Every day | CUTANEOUS | 2 refills | Status: DC

## 2021-05-18 MED ORDER — TERBINAFINE HCL 250 MG PO TABS
250.0000 mg | ORAL_TABLET | Freq: Every day | ORAL | 0 refills | Status: DC
Start: 2021-05-18 — End: 2021-05-22

## 2021-05-19 ENCOUNTER — Telehealth: Payer: Self-pay | Admitting: *Deleted

## 2021-05-19 NOTE — Telephone Encounter (Signed)
Patient is calling because the medication(Terbinafine),insurance will not cover, is there something else he can prescribe? Please advise.

## 2021-05-22 MED ORDER — FLUCONAZOLE 150 MG PO TABS: 150.0000 mg | ORAL_TABLET | ORAL | 0 refills | Status: DC

## 2021-05-22 NOTE — Progress Notes (Signed)
  Subjective:  Patient ID: Jeffrey Rodriguez, male    DOB: 05/28/1966,  MRN: [id number]  Chief Complaint  Patient presents with   Nail Problem      np-toenail fungus and athlete's foot-req a thur am appt-david tysinger, pa refer   Tinea Pedis   Diabetes    A1C  7.1    55 y.o. male presents with the above complaint. History confirmed with patient.  Primary concern is itching scaling skin between the toes and on the bottom of the foot as well as thickened discolored yellow toenails  Objective:  Physical Exam: warm, good capillary refill, no trophic changes or ulcerative lesions, normal DP and PT pulses, normal sensory exam, tinea pedis, and thick and yellow discolored toenails with subungual debris.      Assessment:   1. Tinea pedis of both feet   2. Onychomycosis      Plan:  Patient was evaluated and treated and all questions answered.  Patient educated on diabetes. Discussed proper diabetic foot care and discussed risks and complications of disease. Educated patient in depth on reasons to return to the office immediately should he/she discover anything concerning or new on the feet. All questions answered. Discussed proper shoes as well.   Discussed etiology and treatment options of onychomycosis.  Discussed oral and topical treatment.  I recommended oral treatment with Lamisil.  Last labs reviewed and LFTs normal.  His insurance did not cover Lamisil.  I did change him to fluconazole pulsed dosing for 6 months and we will see if that makes a difference  Return in about 4 months (around 09/15/2021) for follow up after nail fungus treatment.

## 2021-05-22 NOTE — Telephone Encounter (Signed)
Returned the call to patient, informing that a new Rx (Fluconazole)has been sent to pharmacy on file,he verbalized understanding.

## 2021-06-19 ENCOUNTER — Encounter: Payer: PRIVATE HEALTH INSURANCE | Admitting: Medical

## 2021-06-23 ENCOUNTER — Ambulatory Visit: Payer: PRIVATE HEALTH INSURANCE | Admitting: Medical

## 2021-06-23 ENCOUNTER — Encounter: Payer: Self-pay | Admitting: Medical

## 2021-06-23 VITALS — BP 110/70 | HR 74 | Ht 70.5 in | Wt 215.4 lb

## 2021-06-23 DIAGNOSIS — G8929 Other chronic pain: Secondary | ICD-10-CM

## 2021-06-23 DIAGNOSIS — Z7185 Encounter for immunization safety counseling: Secondary | ICD-10-CM

## 2021-06-23 DIAGNOSIS — B351 Tinea unguium: Secondary | ICD-10-CM

## 2021-06-23 DIAGNOSIS — K635 Polyp of colon: Secondary | ICD-10-CM

## 2021-06-23 DIAGNOSIS — I8393 Asymptomatic varicose veins of bilateral lower extremities: Secondary | ICD-10-CM

## 2021-06-23 DIAGNOSIS — Z23 Encounter for immunization: Secondary | ICD-10-CM

## 2021-06-23 DIAGNOSIS — I10 Essential (primary) hypertension: Secondary | ICD-10-CM

## 2021-06-23 DIAGNOSIS — Z79899 Other long term (current) drug therapy: Secondary | ICD-10-CM

## 2021-06-23 DIAGNOSIS — M5441 Lumbago with sciatica, right side: Secondary | ICD-10-CM

## 2021-06-23 DIAGNOSIS — M79671 Pain in right foot: Secondary | ICD-10-CM

## 2021-06-23 DIAGNOSIS — E782 Mixed hyperlipidemia: Secondary | ICD-10-CM

## 2021-06-23 DIAGNOSIS — F17200 Nicotine dependence, unspecified, uncomplicated: Secondary | ICD-10-CM

## 2021-06-23 DIAGNOSIS — Z0000 Encounter for general adult medical examination without abnormal findings: Secondary | ICD-10-CM

## 2021-06-23 DIAGNOSIS — Z125 Encounter for screening for malignant neoplasm of prostate: Secondary | ICD-10-CM

## 2021-06-23 DIAGNOSIS — E118 Type 2 diabetes mellitus with unspecified complications: Secondary | ICD-10-CM

## 2021-06-23 DIAGNOSIS — R809 Proteinuria, unspecified: Secondary | ICD-10-CM

## 2021-06-23 NOTE — Progress Notes (Signed)
Subjective:   HPI  Jeffrey Rodriguez is a 55 y.o. male who presents for Chief Complaint  Patient presents with   fasting cpe    Fasting cpe, has issue with right foot thinks its arthitis    Patient Care Team: Jeffrey Rodriguez, Jeffrey Rodriguez as PCP - General (Family Medicine) Sees dentist Sees eye doctor Dr. Wilfrid Rodriguez, GI Dr. Fransico Rodriguez, cardiology Dr. Lanae Rodriguez, podiatry   Concerns: He notes top of right foot sore recently.   Been hurting 3 weeks.  No swelling or redness.  No injury.  Ankle or toes don't hurt.    Compliant with medication  Sugars been running good.  120-130s fasting.    Reviewed their medical, surgical, family, social, medication, and allergy history and updated chart as appropriate.  Past Medical History:  Diagnosis Date   Diabetes mellitus without complication ([place]) 4259   GERD (gastroesophageal reflux disease)    Hyperlipidemia    Hypertension 2010   Microalbuminuria 11/2013   Mixed dyslipidemia    diet and exercise   Obesity    Smoker    0.5ppd   Wears glasses     Past Surgical History:  Procedure Laterality Date   COLONOSCOPY  12/2015   tubular adenoma, Dr. Wilfrid Rodriguez   WISDOM TOOTH EXTRACTION      Family History  Problem Relation Age of Onset   Pneumonia Father    Arthritis Mother    Depression Brother    Hypertension Other    Cancer Neg Hx    Stroke Neg Hx    Colon cancer Neg Hx    Colon polyps Neg Hx    Esophageal cancer Neg Hx    Rectal cancer Neg Hx    Stomach cancer Neg Hx    Heart disease Neg Hx    Diabetes Neg Hx      Current Outpatient Medications:    aspirin (EQ ASPIRIN ADULT LOW DOSE) 81 MG EC tablet, Take 1 tablet (81 mg total) by mouth daily. Swallow whole., Disp: 90 tablet, Rfl: 3   atorvastatin (LIPITOR) 20 MG tablet, Take 1 tablet (20 mg total) by mouth daily., Disp: 90 tablet, Rfl: 3   blood glucose meter kit and supplies KIT, Inject 1 each into the skin as directed. Dispense based on patient and insurance  preference. Use up to four times daily as directed, Disp: 100 each, Rfl: 3   fluconazole (DIFLUCAN) 150 MG tablet, Take 1 tablet (150 mg total) by mouth once a week for 26 doses., Disp: 26 tablet, Rfl: 0   Insulin Glargine-Lixisenatide (SOLIQUA) 100-33 UNT-MCG/ML SOPN, Inject 20 Units into the skin daily., Disp: 6 mL, Rfl: 5   ketoconazole (NIZORAL) 2 % cream, Apply 1 application topically daily., Disp: 60 g, Rfl: 2   lisinopril-hydrochlorothiazide (ZESTORETIC) 20-12.5 MG tablet, Take 1 tablet by mouth daily., Disp: 90 tablet, Rfl: 3   metFORMIN (GLUCOPHAGE) 1000 MG tablet, Take 1 tablet (1,000 mg total) by mouth 2 (two) times daily with a meal. (Patient taking differently: Take 1,000 mg by mouth daily with breakfast.), Disp: 180 tablet, Rfl: 3   omeprazole (PRILOSEC) 20 MG capsule, Take 1 capsule (20 mg total) by mouth daily as needed. Reported on 01/12/2016, Disp: 90 capsule, Rfl: 3   BD PEN NEEDLE NANO 2ND GEN 32G X 4 MM MISC, USE 1  AT BEDTIME, Disp: 100 each, Rfl: 0  No Known Allergies   Review of Systems Constitutional: -fever, -chills, -sweats, -unexpected weight change, -decreased appetite, -fatigue Allergy: -sneezing, -itching, -congestion  Dermatology: -changing moles, --rash, -lumps ENT: -runny nose, -ear pain, -sore throat, -hoarseness, -sinus pain, -teeth pain, - ringing in ears, -hearing loss, -nosebleeds Cardiology: -chest pain, -palpitations, -swelling, -difficulty breathing when lying flat, -waking up short of breath Respiratory: -cough, -shortness of breath, -difficulty breathing with exercise or exertion, -wheezing, -coughing up blood Gastroenterology: -abdominal pain, -nausea, -vomiting, -diarrhea, -constipation, -blood in stool, -changes in bowel movement, -difficulty swallowing or eating Hematology: -bleeding, -bruising  Musculoskeletal: +joint aches, -muscle aches, -joint swelling, -back pain, -neck pain, -cramping, -changes in gait Ophthalmology: denies vision changes, eye  redness, itching, discharge Urology: -burning with urination, -difficulty urinating, -blood in urine, -urinary frequency, -urgency, -incontinence Neurology: -headache, -weakness, -tingling, -numbness, -memory loss, -falls, -dizziness Psychology: -depressed mood, -agitation, -sleep problems Male GU: no testicular mass, pain, no lymph nodes swollen, no swelling, no rash.  Depression screen [facility] 2/9 06/23/2021 04/06/2021 06/17/2020 04/24/2019 03/27/2018  Decreased Interest 0 0 0 0 0  Down, Depressed, Hopeless 0 0 0 0 0  PHQ - 2 Score 0 0 0 0 0        Objective:  BP 110/70   Pulse 74   Ht 5' 10.5" (1.791 m)   Wt 215 lb 6.4 oz (97.7 kg)   BMI 30.47 kg/m   General appearance: alert, no distress, WD/WN, African American male Skin: unremarkable HEENT: normocephalic, conjunctiva/corneas normal, sclerae anicteric, PERRLA, EOMi Neck: supple, no lymphadenopathy, no thyromegaly, no masses, normal ROM, no bruits Chest: non tender, normal shape and expansion Heart: RRR, normal S1, S2, no murmurs Lungs: CTA bilaterally, no wheezes, rhonchi, or rales Abdomen: +bs, soft, non tender, non distended, no masses, no hepatomegaly, no splenomegaly, no bruits Back: non tender, normal ROM, no scoliosis Musculoskeletal: no obvious deformity of feet, no swelling, normal ROM of ankles and feet, otherwise upper extremities non tender, no obvious deformity, normal ROM throughout, lower extremities non tender, no obvious deformity, normal ROM throughout Extremities: no edema, no cyanosis, no clubbing Pulses: 2+ symmetric, upper and lower extremities, normal cap refill Neurological: alert, oriented x 3, CN2-12 intact, strength normal upper extremities and lower extremities, sensation normal throughout, DTRs 2+ throughout, no cerebellar signs, gait normal Psychiatric: normal affect, behavior normal, pleasant  GU: normal male external genitalia,circumcised, nontender, no masses, no hernia, no lymphadenopathy Rectal:  declines   Assessment and Plan :   Encounter Diagnoses  Name Primary?   Need for pneumococcal vaccination Yes   Encounter for health maintenance examination in adult    Varicose veins of both lower extremities, unspecified whether complicated    Vaccine counseling    Smoker    Screening for prostate cancer    Polyp of colon, unspecified part of colon, unspecified type    Onychomycosis    Mixed dyslipidemia    Microalbuminuria    High risk medication use    Essential hypertension    Diabetes mellitus with complication ([place])    Chronic right-sided low back pain with right-sided sciatica    Foot pain, right     This visit was a preventative care visit, also known as wellness visit or routine physical.   Topics typically include healthy lifestyle, diet, exercise, preventative care, vaccinations, sick and well care, proper use of emergency dept and after hours care, as well as other concerns.     Recommendations: Continue to return yearly for your annual wellness and preventative care visits.  This gives [place] a chance to discuss healthy lifestyle, exercise, vaccinations, review your chart record, and perform screenings where appropriate.  I recommend  you see your eye doctor yearly for routine vision care.  I recommend you see your dentist yearly for routine dental care including hygiene visits twice yearly.   Vaccination recommendations were reviewed Immunization History  Administered Date(s) Administered   Influenza,inj,Quad PF,6+ Mos 03/11/2014, 04/12/2016, 03/27/2018, 04/29/2019, 04/07/2020, 04/06/2021   Influenza-Unspecified 03/17/2015, 03/27/2017   PFIZER(Purple Top)SARS-COV-2 Vaccination 11/14/2019, 12/05/2019   PNEUMOCOCCAL CONJUGATE-20 06/23/2021   Pneumococcal Polysaccharide-23 11/19/2013   Tdap 01/12/2016   Zoster Recombinat (Shingrix) 10/26/2020, 12/29/2020   I recommend an updated pneumonia vaccine, Prevnar 20  Counseled on the pneumococcal vaccine.  Vaccine  information sheet given.  Pneumococcal vaccine PPSV20 given after consent obtained.  Get [place] a copy of your covid vaccines.      Screening for cancer: Colon cancer screening: I reviewed your colonoscopy on file that is up to date from 2017.  You are due for repeat colonoscopy 2024. You completed Hemoccult card in 2020 that was negative  We discussed PSA, prostate exam, and prostate cancer screening risks/benefits.     Skin cancer screening: Check your skin regularly for new changes, growing lesions, or other lesions of concern Come in for evaluation if you have skin lesions of concern.  Lung cancer screening: If you have a greater than 20 pack year history of tobacco use, then you may qualify for lung cancer screening with a chest CT scan.   Please call your insurance company to inquire about coverage for this test.  We currently don't have screenings for other cancers besides breast, cervical, colon, and lung cancers.  If you have a strong family history of cancer or have other cancer screening concerns, please let me know.    Bone health: Get at least 150 minutes of aerobic exercise weekly Get weight bearing exercise at least once weekly Bone density test:  A bone density test is an imaging test that uses a type of X-ray to measure the amount of calcium and other minerals in your bones. The test may be used to diagnose or screen you for a condition that causes weak or thin bones (osteoporosis), predict your risk for a broken bone (fracture), or determine how well your osteoporosis treatment is working. The bone density test is recommended for females 77 and older, or females or males <78 if certain risk factors such as thyroid disease, long term use of steroids such as for asthma or rheumatological issues, vitamin D deficiency, estrogen deficiency, family history of osteoporosis, self or family history of fragility fracture in first degree relative.    Heart health: Get at least  150 minutes of aerobic exercise weekly Limit alcohol It is important to maintain a healthy blood pressure and healthy cholesterol numbers  Heart disease screening: Screening for heart disease includes screening for blood pressure, fasting lipids, glucose/diabetes screening, BMI height to weight ratio, reviewed of smoking status, physical activity, and diet.    Goals include blood pressure 120/80 or less, maintaining a healthy lipid/cholesterol profile, preventing diabetes or keeping diabetes numbers under good control, not smoking or using tobacco products, exercising most days per week or at least 150 minutes per week of exercise, and eating healthy variety of fruits and vegetables, healthy oils, and avoiding unhealthy food choices like fried food, fast food, high sugar and high cholesterol foods.    Other tests may possibly include EKG test, CT coronary calcium score, echocardiogram, exercise treadmill stress test.   I reviewed your exercise stress test from 2019 that was fine.  We discussed ABI blood flow screen  Medical care options: I recommend you continue to seek care here first for routine care.  We try really hard to have available appointments Monday through Friday daytime hours for sick visits, acute visits, and physicals.  Urgent care should be used for after hours and weekends for significant issues that cannot wait till the next day.  The emergency department should be used for significant potentially life-threatening emergencies.  The emergency department is expensive, can often have long wait times for less significant concerns, so try to utilize primary care, urgent care, or telemedicine when possible to avoid unnecessary trips to the emergency department.  Virtual visits and telemedicine have been introduced since the pandemic started in 2020, and can be convenient ways to receive medical care.  We offer virtual appointments as well to assist you in a variety of options to seek  medical care.   Advanced Directives: I recommend you consider completing a [place] and Living Will.   These documents respect your wishes and help alleviate burdens on your loved ones if you were to become terminally ill or be in a position to need those documents enforced.    You can complete Advanced Directives yourself, have them notarized, then have copies made for our office, for you and for anybody you feel should have them in safe keeping.  Or, you can have an attorney prepare these documents.   If you haven't updated your Last Will and Testament in a while, it may be worthwhile having an attorney prepare these documents together and save on some costs.       Separate significant issues discussed: Continue current medications for chronic issues.  I reviewed labs from a month ago through work.  Lipids showed low HDL, otherwise lipids were ok.  BP was great at his work [employer] was seen today for fasting cpe.  Diagnoses and all orders for this visit:  Need for pneumococcal vaccination -     Pneumococcal conjugate vaccine 20-valent (Prevnar 20)  Encounter for health maintenance examination in adult -     Comprehensive metabolic panel -     CBC -     Hemoglobin A1c -     Microalbumin/Creatinine Ratio, Urine -     PSA -     Urinalysis  Varicose veins of both lower extremities, unspecified whether complicated  Vaccine counseling  Smoker  Screening for prostate cancer -     PSA  Polyp of colon, unspecified part of colon, unspecified type  Onychomycosis  Mixed dyslipidemia  Microalbuminuria -     Microalbumin/Creatinine Ratio, Urine  High risk medication use  Essential hypertension  Diabetes mellitus with complication (HCC) -     Comprehensive metabolic panel -     Hemoglobin A1c  Chronic right-sided low back pain with right-sided sciatica  Foot pain, right   Follow-up pending labs, yearly for physical

## 2021-06-24 ENCOUNTER — Other Ambulatory Visit: Payer: Self-pay | Admitting: Medical

## 2021-06-24 LAB — URINALYSIS
Bilirubin, UA: NEGATIVE
Glucose, UA: NEGATIVE
Ketones, UA: NEGATIVE
Leukocytes,UA: NEGATIVE
Nitrite, UA: NEGATIVE
RBC, UA: NEGATIVE
Specific Gravity, UA: 1.023 (ref 1.005–1.030)
Urobilinogen, Ur: 0.2 mg/dL (ref 0.2–1.0)
pH, UA: 6 (ref 5.0–7.5)

## 2021-06-24 LAB — CBC
Hematocrit: 41.4 % (ref 37.5–51.0)
Hemoglobin: 14.4 g/dL (ref 13.0–17.7)
MCH: 29 pg (ref 26.6–33.0)
MCHC: 34.8 g/dL (ref 31.5–35.7)
MCV: 84 fL (ref 79–97)
Platelets: 228 10*3/uL (ref 150–450)
RBC: 4.96 x10E6/uL (ref 4.14–5.80)
RDW: 13.2 % (ref 11.6–15.4)
WBC: 5.1 10*3/uL (ref 3.4–10.8)

## 2021-06-24 LAB — COMPREHENSIVE METABOLIC PANEL
ALT: 17 IU/L (ref 0–44)
AST: 18 IU/L (ref 0–40)
Albumin/Globulin Ratio: 1.5 (ref 1.2–2.2)
Albumin: 4.5 g/dL (ref 3.8–4.9)
Alkaline Phosphatase: 85 IU/L (ref 44–121)
BUN/Creatinine Ratio: 13 (ref 9–20)
BUN: 15 mg/dL (ref 6–24)
Bilirubin Total: 0.3 mg/dL (ref 0.0–1.2)
CO2: 27 mmol/L (ref 20–29)
Calcium: 9.5 mg/dL (ref 8.7–10.2)
Chloride: 101 mmol/L (ref 96–106)
Creatinine, Ser: 1.14 mg/dL (ref 0.76–1.27)
Globulin, Total: 3 g/dL (ref 1.5–4.5)
Glucose: 107 mg/dL — ABNORMAL HIGH (ref 70–99)
Potassium: 3.9 mmol/L (ref 3.5–5.2)
Sodium: 141 mmol/L (ref 134–144)
Total Protein: 7.5 g/dL (ref 6.0–8.5)
eGFR: 75 mL/min/{1.73_m2} (ref >59)

## 2021-06-24 LAB — HEMOGLOBIN A1C
Est. average glucose Bld gHb Est-mCnc: 171 mg/dL
Hgb A1c MFr Bld: 7.6 % — ABNORMAL HIGH (ref 4.8–5.6)

## 2021-06-24 LAB — MICROALBUMIN / CREATININE URINE RATIO
Creatinine, Urine: 165.9 mg/dL
Microalb/Creat Ratio: 22 mg/g creat (ref 0–29)
Microalbumin, Urine: 36.8 ug/mL

## 2021-06-24 LAB — PSA: Prostate Specific Ag, Serum: 1.5 ng/mL (ref 0.0–4.0)

## 2021-06-24 MED ORDER — ASPIRIN 81 MG PO TBEC: 81.0000 mg | DELAYED_RELEASE_TABLET | Freq: Every day | ORAL | 3 refills | Status: DC

## 2021-06-24 MED ORDER — LISINOPRIL-HYDROCHLOROTHIAZIDE 20-12.5 MG PO TABS: 1.0000 | ORAL_TABLET | Freq: Every day | ORAL | 3 refills | Status: DC

## 2021-06-24 MED ORDER — OMEPRAZOLE 20 MG PO CPDR: 20.0000 mg | DELAYED_RELEASE_CAPSULE | Freq: Every day | ORAL | 3 refills | Status: DC | PRN

## 2021-06-24 MED ORDER — ATORVASTATIN CALCIUM 20 MG PO TABS: 20.0000 mg | ORAL_TABLET | Freq: Every day | ORAL | 3 refills | Status: DC

## 2021-06-24 MED ORDER — BD PEN NEEDLE NANO 2ND GEN 32G X 4 MM MISC: 0 refills | Status: DC

## 2021-06-24 MED ORDER — METFORMIN HCL 1000 MG PO TABS: 1000.0000 mg | ORAL_TABLET | Freq: Every day | ORAL | 3 refills | Status: DC

## 2021-07-06 ENCOUNTER — Encounter: Payer: PRIVATE HEALTH INSURANCE | Admitting: Medical

## 2021-07-18 ENCOUNTER — Other Ambulatory Visit: Payer: Self-pay

## 2021-07-18 ENCOUNTER — Ambulatory Visit
Admission: EM | Admit: 2021-07-18 | Discharge: 2021-07-18 | Disposition: A | Discharge status: Home / Self Care | Payer: PRIVATE HEALTH INSURANCE | Attending: Physician Assistant | Admitting: Physician Assistant

## 2021-07-18 DIAGNOSIS — Z1152 Encounter for screening for COVID-19: Secondary | ICD-10-CM

## 2021-07-18 DIAGNOSIS — J069 Acute upper respiratory infection, unspecified: Secondary | ICD-10-CM

## 2021-07-18 MED ORDER — OSELTAMIVIR PHOSPHATE 75 MG PO CAPS: 75.0000 mg | ORAL_CAPSULE | Freq: Two times a day (BID) | ORAL | 0 refills | Status: DC

## 2021-07-18 NOTE — ED Provider Notes (Signed)
[facility]    CSN: [id number] Arrival date & time: 07/18/21  1145      History   Chief Complaint Chief Complaint  Patient presents with   Cough    HPI Jeffrey Rodriguez is a 56 y.o. male.   Patient here today for evaluation of body aches, chills, nasal congestion and cough that started 2 to 3 days ago.  He has not had any nausea or vomiting.  He has had some diarrhea.  He has tried Mucinex without significant relief.  The history is provided by the patient.  Cough Associated symptoms: chills and sore throat   Associated symptoms: no ear pain, no eye discharge, no fever and no shortness of breath    Past Medical History:  Diagnosis Date   Diabetes mellitus without complication ([place]) 4098   GERD (gastroesophageal reflux disease)    Hyperlipidemia    Hypertension 2010   Microalbuminuria 11/2013   Mixed dyslipidemia    diet and exercise   Obesity    Smoker    0.5ppd   Wears glasses     Patient Active Problem List   Diagnosis Date Noted   Foot pain, right 06/23/2021   Needs flu shot 04/06/2021   Screen for colon cancer 04/06/2021   Polyp of colon 04/06/2021   Contact with and (suspected) exposure to covid-19 03/22/2021   Chronic right-sided low back pain with right-sided sciatica 01/18/2021   Screening for prostate cancer 06/17/2020   Varicose veins of both lower extremities 06/17/2020   High risk medication use 12/10/2019   Mixed dyslipidemia 02/19/2017   Vaccine counseling 02/19/2017   Onychomycosis 07/26/2016   Need for influenza vaccination 04/12/2016   Microalbuminuria 01/12/2016   Routine general medical examination at a health care facility 10/05/2015   Smoker 10/05/2015   Essential hypertension 10/05/2015   Diabetes mellitus with complication ([place]) 11/91/4782    Past Surgical History:  Procedure Laterality Date   COLONOSCOPY  12/2015   tubular adenoma, Dr. Wilfrid Lund   WISDOM TOOTH EXTRACTION         Home Medications    Prior to  Admission medications   Medication Sig Start Date End Date Taking? Authorizing Provider  aspirin (EQ ASPIRIN ADULT LOW DOSE) 81 MG EC tablet Take 1 tablet (81 mg total) by mouth daily. Swallow whole. 06/24/21  Yes Tysinger, Camelia Eng, PA-C  atorvastatin (LIPITOR) 20 MG tablet Take 1 tablet (20 mg total) by mouth daily. 06/24/21  Yes Tysinger, Camelia Eng, PA-C  blood glucose meter kit and supplies KIT Inject 1 each into the skin as directed. Dispense based on patient and insurance preference. Use up to four times daily as directed 06/18/20  Yes Tysinger, Camelia Eng, PA-C  Insulin Glargine-Lixisenatide (SOLIQUA) 100-33 UNT-MCG/ML SOPN Inject 20 Units into the skin daily. 04/06/21  Yes Tysinger, Camelia Eng, PA-C  Insulin Pen Needle (BD PEN NEEDLE NANO 2ND GEN) 32G X 4 MM MISC USE 1  AT BEDTIME 06/24/21  Yes Tysinger, Camelia Eng, PA-C  ketoconazole (NIZORAL) 2 % cream Apply 1 application topically daily. 05/18/21  Yes McDonald, Stephan Minister, DPM  lisinopril-hydrochlorothiazide (ZESTORETIC) 20-12.5 MG tablet Take 1 tablet by mouth daily. 06/24/21  Yes Tysinger, Camelia Eng, PA-C  metFORMIN (GLUCOPHAGE) 1000 MG tablet Take 1 tablet (1,000 mg total) by mouth daily with breakfast. 06/24/21 06/24/22 Yes Tysinger, Camelia Eng, PA-C  omeprazole (PRILOSEC) 20 MG capsule Take 1 capsule (20 mg total) by mouth daily as needed. Reported on 01/12/2016 06/24/21  Yes Tysinger, Camelia Eng,  PA-C  oseltamivir (TAMIFLU) 75 MG capsule Take 1 capsule (75 mg total) by mouth every 12 (twelve) hours. 07/18/21  Yes Francene Finders, PA-C    Family History Family History  Problem Relation Age of Onset   Pneumonia Father    Arthritis Mother    Depression Brother    Hypertension Other    Cancer Neg Hx    Stroke Neg Hx    Colon cancer Neg Hx    Colon polyps Neg Hx    Esophageal cancer Neg Hx    Rectal cancer Neg Hx    Stomach cancer Neg Hx    Heart disease Neg Hx    Diabetes Neg Hx     Social History Social History   Tobacco Use   Smoking status:  Every Day    Packs/day: 0.50    Years: 26.00    Pack years: 13.00    Types: Cigarettes   Smokeless tobacco: Never  Vaping Use   Vaping Use: Never used  Substance Use Topics   Alcohol use: No    Alcohol/week: 0.0 standard drinks   Drug use: No     Allergies   Patient has no known allergies.   Review of Systems Review of Systems  Constitutional:  Positive for chills. Negative for fever.  HENT:  Positive for congestion and sore throat. Negative for ear pain.   Eyes:  Negative for discharge and redness.  Respiratory:  Positive for cough. Negative for shortness of breath.   Gastrointestinal:  Positive for diarrhea. Negative for abdominal pain, nausea and vomiting.    Physical Exam Triage Vital Signs ED Triage Vitals  Enc Vitals Group     BP 07/18/21 1241 129/87     Pulse Rate 07/18/21 1241 93     Resp 07/18/21 1241 18     Temp 07/18/21 1241 98.4 F (36.9 C)     Temp Source 07/18/21 1241 Oral     SpO2 07/18/21 1241 97 %     Weight --      Height --      Head Circumference --      Peak Flow --      Pain Score 07/18/21 1239 0     Pain Loc --      Pain Edu? --      Excl. in [place]? --    No data found.  Updated Vital Signs BP 129/87 (BP Location: Left Arm)    Pulse 93    Temp 98.4 F (36.9 C) (Oral)    Resp 18    SpO2 97%      Physical Exam Vitals and nursing note reviewed.  Constitutional:      General: He is not in acute distress.    Appearance: Normal appearance. He is not ill-appearing.  HENT:     Head: Normocephalic and atraumatic.     Nose: Congestion present.  Eyes:     Conjunctiva/sclera: Conjunctivae normal.  Cardiovascular:     Rate and Rhythm: Normal rate and regular rhythm.     Heart sounds: Normal heart sounds. No murmur heard. Pulmonary:     Effort: Pulmonary effort is normal. No respiratory distress.     Breath sounds: Normal breath sounds. No wheezing, rhonchi or rales.  Skin:    General: Skin is warm and dry.  Neurological:     Mental  Status: He is alert.  Psychiatric:        Mood and Affect: Mood normal.  Thought Content: Thought content normal.     UC Treatments / Results  Labs (all labs ordered are listed, but only abnormal results are displayed) Labs Reviewed  COVID-19, FLU A+B NAA    EKG   Radiology No results found.  Procedures Procedures (including critical care time)  Medications Ordered in UC Medications - No data to display  Initial Impression / Assessment and Plan / UC Course  I have reviewed the triage vital signs and the nursing notes.  Pertinent labs & imaging results that were available during my care of the patient were reviewed by me and considered in my medical decision making (see chart for details).  Will order screening for COVID and flu.  Tamiflu prescribed while we await results.  Recommend symptomatic treatment otherwise.  Encouraged follow-up with any further concerns.   Final Clinical Impressions(s) / UC Diagnoses   Final diagnoses:  Encounter for screening for COVID-19  Acute upper respiratory infection   Discharge Instructions   None    ED Prescriptions     Medication Sig Dispense Auth. Provider   oseltamivir (TAMIFLU) 75 MG capsule Take 1 capsule (75 mg total) by mouth every 12 (twelve) hours. 10 capsule Francene Finders, PA-C      PDMP not reviewed this encounter.   Francene Finders, PA-C 07/18/21 1355

## 2021-07-18 NOTE — ED Triage Notes (Signed)
Pt reports cough, sinus pressure, nasal congestion, diarrhea x 2-3 days.  No known exposure to covid/flu.

## 2021-07-20 LAB — COVID-19, FLU A+B NAA
Influenza A, NAA: NOT DETECTED
Influenza B, NAA: NOT DETECTED
SARS-CoV-2, NAA: NOT DETECTED

## 2021-09-03 IMAGING — CR DG LUMBAR SPINE COMPLETE 4+V
5 series · 5 of 5 positions shown · non-contrast
Comparison: Hip radiographs dictated separately.

CLINICAL DATA: Right hip pain with known sciatica.

EXAM:
LUMBAR SPINE - COMPLETE 4+ VIEW

[w lumbar spine ap]
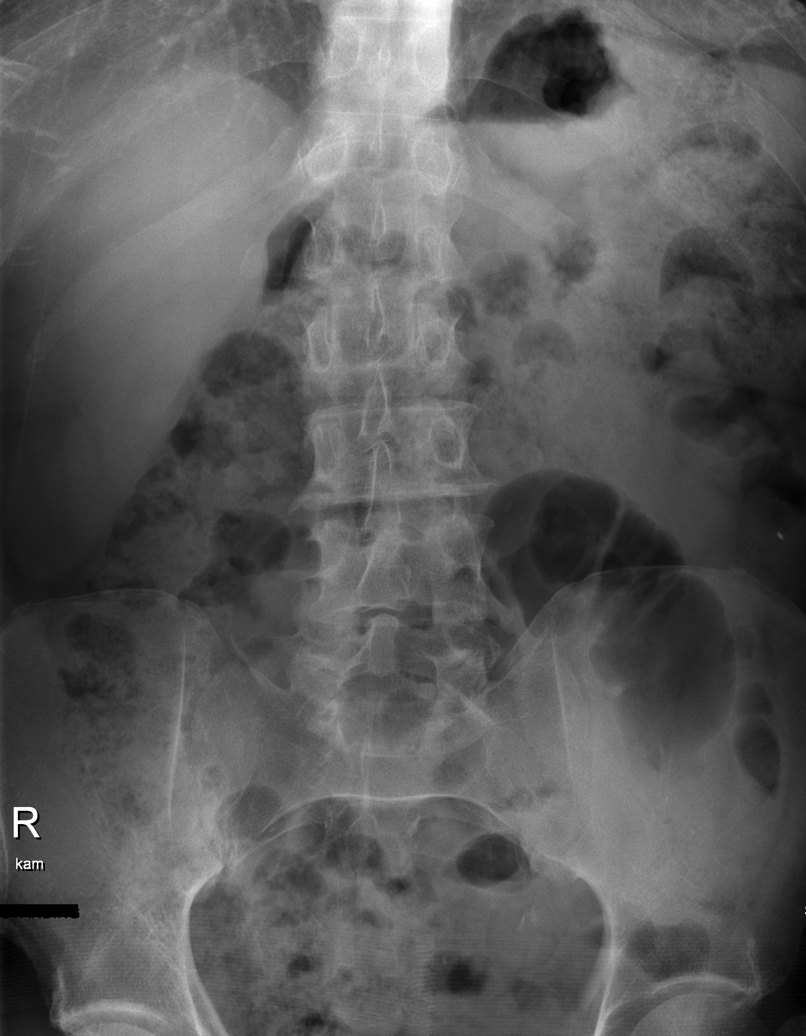

[w lumbar spine obl (1 of 2)]
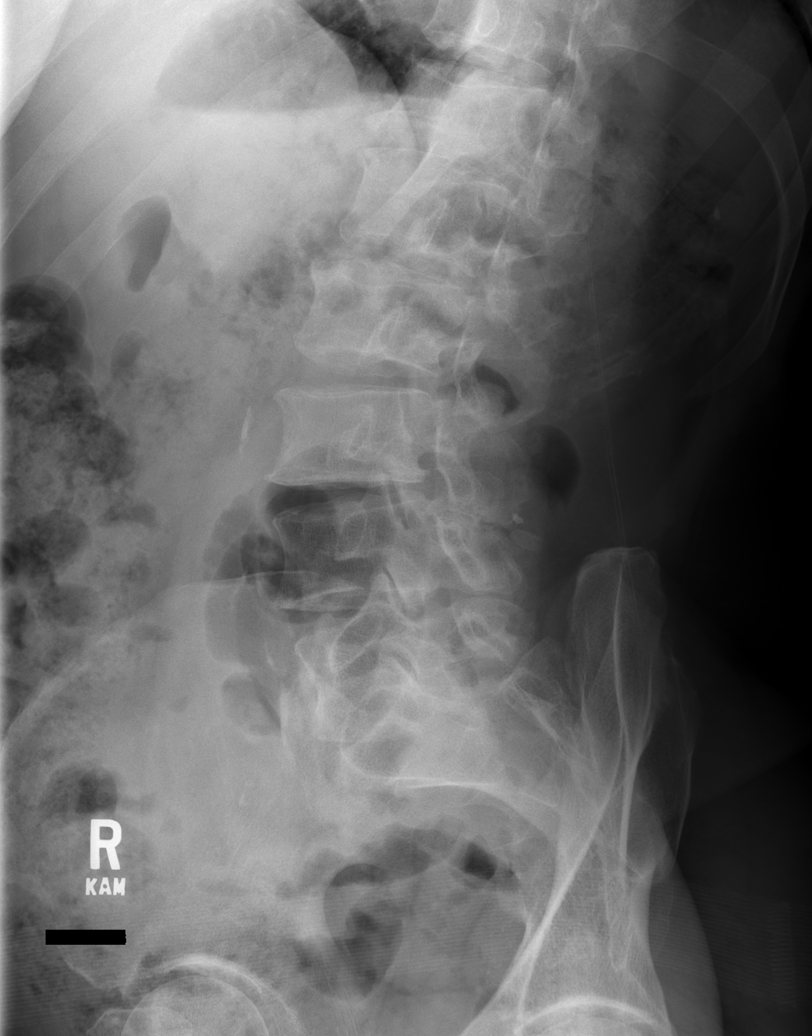

[w lumbar spine obl (2 of 2)]
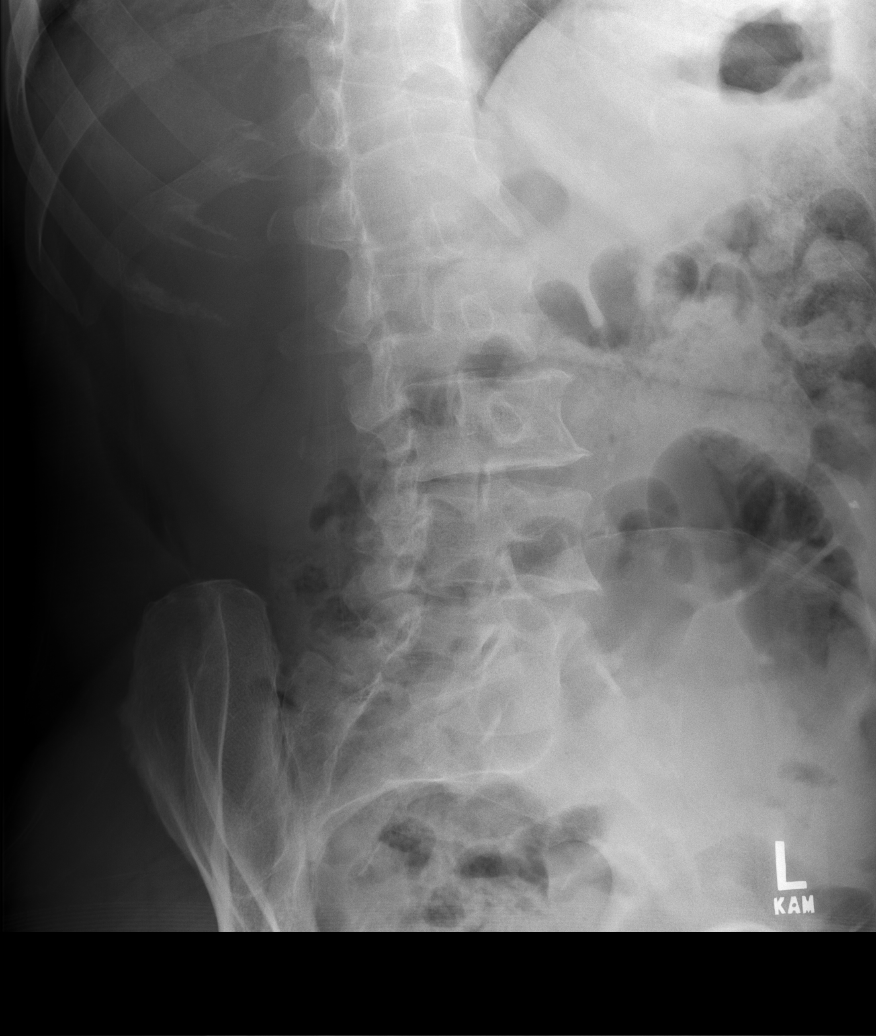

[w lumbar spine lat]
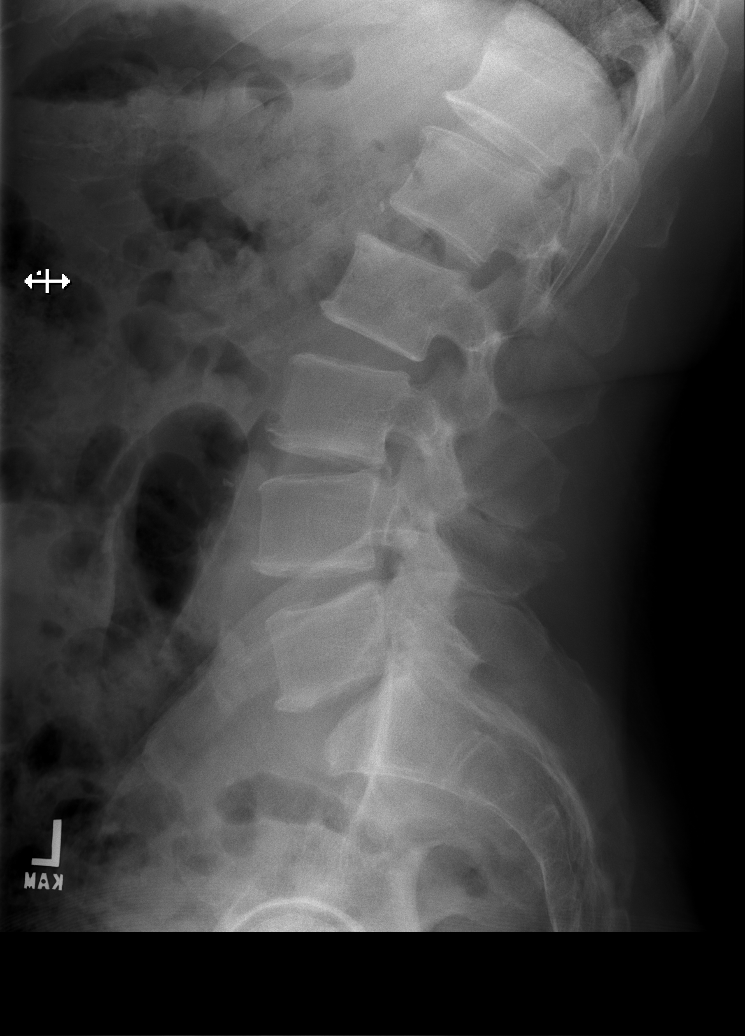

[w lumbar l-5 s-1 spot]
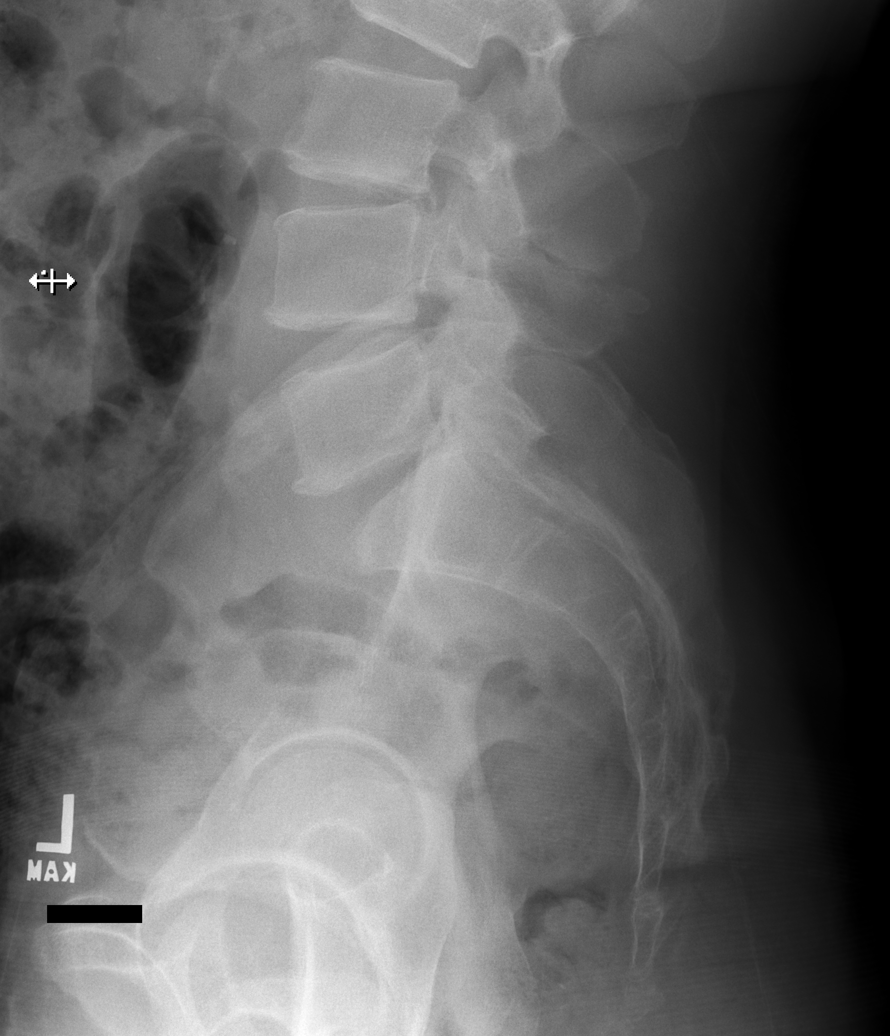

[5 of 5 positions shown; findings below may reference images not displayed]

FINDINGS: Five lumbar type vertebral bodies. Right sacroiliac joint sclerosis
is mild, likely degenerative. Maintenance of vertebral body height
and alignment. Loss of intervertebral disc height at L5-S1 is mild.
Facet arthropathy at this level as well as L4-5. Aortic
atherosclerosis.
IMPRESSION: Lower lumbar spondylosis, without acute osseous finding.

Aortic Atherosclerosis ([id number].J).

## 2021-09-14 ENCOUNTER — Ambulatory Visit: Payer: PRIVATE HEALTH INSURANCE | Admitting: Podiatry

## 2021-10-17 ENCOUNTER — Other Ambulatory Visit: Payer: Self-pay | Admitting: Medical

## 2021-10-17 NOTE — Telephone Encounter (Signed)
Pt has an appt end of month ?

## 2021-11-09 ENCOUNTER — Ambulatory Visit: Payer: PRIVATE HEALTH INSURANCE | Admitting: Medical

## 2021-11-16 ENCOUNTER — Encounter: Payer: Self-pay | Admitting: Medical

## 2021-11-16 ENCOUNTER — Ambulatory Visit: Payer: PRIVATE HEALTH INSURANCE | Admitting: Medical

## 2021-11-16 DIAGNOSIS — K635 Polyp of colon: Secondary | ICD-10-CM | POA: Diagnosis not present

## 2021-11-16 DIAGNOSIS — Z7185 Encounter for immunization safety counseling: Secondary | ICD-10-CM

## 2021-11-16 DIAGNOSIS — I10 Essential (primary) hypertension: Secondary | ICD-10-CM | POA: Diagnosis not present

## 2021-11-16 DIAGNOSIS — R809 Proteinuria, unspecified: Secondary | ICD-10-CM

## 2021-11-16 DIAGNOSIS — E118 Type 2 diabetes mellitus with unspecified complications: Secondary | ICD-10-CM

## 2021-11-16 DIAGNOSIS — E782 Mixed hyperlipidemia: Secondary | ICD-10-CM | POA: Diagnosis not present

## 2021-11-16 MED ORDER — METFORMIN HCL 1000 MG PO TABS: 1000.0000 mg | ORAL_TABLET | Freq: Every day | ORAL | 3 refills | Status: DC

## 2021-11-16 NOTE — Assessment & Plan Note (Signed)
On ACE, BP and diabetes have been controlled ? ?

## 2021-11-16 NOTE — Assessment & Plan Note (Signed)
Continue statin, last lipid at goal within past 12 months ?

## 2021-11-16 NOTE — Progress Notes (Signed)
? ?Established Patient Office Visit ? ?Subjective   ?Patient ID: Jeffrey Rodriguez, male    DOB: 08-08-1965  Age: 56 y.o. MRN: [id number] ? ?Chief Complaint  ?Patient presents with  ? Diabetes  ?  Diabetes check no other issues   ? ?Diabetes ?He presents for his follow-up diabetic visit. He has type 2 diabetes mellitus. The initial diagnosis of diabetes was made 2009 years ago. His disease course has been stable. There are no hypoglycemic associated symptoms. Pertinent negatives for hypoglycemia include no headaches. There are no diabetic associated symptoms. Pertinent negatives for diabetes include no blurred vision and no chest pain. There are no hypoglycemic complications. Symptoms are stable. Current diabetic treatment includes insulin injections and diet. He is compliant with treatment all of the time (metformin '1000mg'$  dialy, soliqua 30u daily). His weight is stable. He participates in exercise weekly. There is no change in his home blood glucose trend. His breakfast blood glucose is taken between 7-8 am. His breakfast blood glucose range is generally 110-130 mg/dl. His overall blood glucose range is 110-130 mg/dl. He sees a podiatrist.Eye exam is current.  ?Hyperlipidemia ?Exacerbating diseases include diabetes. Pertinent negatives include no chest pain or shortness of breath. Current antihyperlipidemic treatment includes statins. There are no compliance problems (compliant with atorvastatin '20mg'$  daily).   ?Hypertension ?This is a chronic problem. Pertinent negatives include no blurred vision, chest pain, headaches, peripheral edema or shortness of breath. There are no compliance problems (taking Lisinopril HCT 20/12.'5mg'$  daily).   ? ? ?Patient Active Problem List  ? Diagnosis Date Noted  ? Foot pain, right 06/23/2021  ? Needs flu shot 04/06/2021  ? Screen for colon cancer 04/06/2021  ? Polyp of colon 04/06/2021  ? Contact with and (suspected) exposure to covid-19 03/22/2021  ? Chronic right-sided low back pain with  right-sided sciatica 01/18/2021  ? Screening for prostate cancer 06/17/2020  ? Varicose veins of both lower extremities 06/17/2020  ? High risk medication use 12/10/2019  ? Mixed dyslipidemia 02/19/2017  ? Vaccine counseling 02/19/2017  ? Onychomycosis 07/26/2016  ? Need for influenza vaccination 04/12/2016  ? Microalbuminuria 01/12/2016  ? Routine general medical examination at a health care facility 10/05/2015  ? Smoker 10/05/2015  ? Essential hypertension 10/05/2015  ? Diabetes mellitus with complication ([place]) 93/81/0175  ? ?Past Medical History:  ?Diagnosis Date  ? Diabetes mellitus without complication ([place]) 1025  ? GERD (gastroesophageal reflux disease)   ? Hyperlipidemia   ? Hypertension 2010  ? Microalbuminuria 11/2013  ? Mixed dyslipidemia   ? diet and exercise  ? Obesity   ? Smoker   ? 0.5ppd  ? Wears glasses   ? ?Social History  ? ?Tobacco Use  ? Smoking status: Every Day  ?  Packs/day: 0.50  ?  Years: 26.00  ?  Pack years: 13.00  ?  Types: Cigarettes  ? Smokeless tobacco: Never  ?Vaping Use  ? Vaping Use: Never used  ?Substance Use Topics  ? Alcohol use: No  ?  Alcohol/week: 0.0 standard drinks  ? Drug use: No  ? ?Family Status  ?Relation Name Status  ? Father  Deceased  ? Mother  Alive  ? Brother  Alive  ? Other  (Not Specified)  ? Neg Hx  (Not Specified)  ? ?No Known Allergies ? ?Review of Systems  ?Eyes:  Negative for blurred vision.  ?Respiratory:  Negative for shortness of breath.   ?Cardiovascular:  Negative for chest pain.  ?Neurological:  Negative for headaches.  ? ?  ?  Objective:  ?  ?BP 124/78   Pulse 78   Temp 98.3 ?F (36.8 ?C)   Wt 208 lb 9.6 oz (94.6 kg)   BMI 29.51 kg/m?  ? ?Wt Readings from Last 3 Encounters:  ?11/16/21 208 lb 9.6 oz (94.6 kg)  ?06/23/21 215 lb 6.4 oz (97.7 kg)  ?04/06/21 210 lb 12.8 oz (95.6 kg)  ? ?Physical Exam ?Vitals reviewed.  ?Constitutional:   ?   Appearance: Normal appearance.  ?HENT:  ?   Head: Normocephalic and atraumatic.  ?   Mouth/Throat:  ?   Pharynx: No  posterior oropharyngeal erythema.  ?Cardiovascular:  ?   Rate and Rhythm: Normal rate and regular rhythm.  ?   Pulses: Normal pulses.  ?   Heart sounds: Normal heart sounds.  ?Pulmonary:  ?   Effort: Pulmonary effort is normal.  ?   Breath sounds: Normal breath sounds.  ?Skin: ?   General: Skin is warm.  ?Neurological:  ?   Mental Status: He is alert.  ?Psychiatric:     ?   Behavior: Behavior normal.  ? ? ? ?  ?Assessment & Plan:  ? ?Problem List Items Addressed This Visit   ? ? Essential hypertension  ?  Continue current medicaiton, controlled, fall 2022 labs reviewed ? ?  ?  ? Diabetes mellitus with complication ([place])  ?  Continue metformin, soliqua, dailiy foot checks, see eye doctor yearly.   Continue glucose monitoring. ? ?  ?  ? Relevant Medications  ? metFORMIN (GLUCOPHAGE) 1000 MG tablet  ? Other Relevant Orders  ? Hemoglobin A1c  ? Microalbuminuria  ?  On ACE, BP and diabetes have been controlled ? ? ?  ?  ? Mixed dyslipidemia  ?  Continue statin, last lipid at goal within past 12 months ? ?  ?  ? Vaccine counseling  ?  Vaccines are up to date ? ?  ?  ? Polyp of colon  ?  Per GI notes, due repeat colonoscopy 12/2022 ? ?  ?  ? ? ?Return fall 2023 for yearly phsyical.  ? ? ?Dorothea Ogle, PA-C ? ?

## 2021-11-16 NOTE — Assessment & Plan Note (Signed)
Per GI notes, due repeat colonoscopy 12/2022 ?

## 2021-11-16 NOTE — Assessment & Plan Note (Signed)
Continue metformin, soliqua, dailiy foot checks, see eye doctor yearly.   Continue glucose monitoring. ?

## 2021-11-16 NOTE — Assessment & Plan Note (Signed)
Continue current medicaiton, controlled, fall 2022 labs reviewed ?

## 2021-11-16 NOTE — Assessment & Plan Note (Signed)
Vaccines are up to date ?

## 2021-11-17 ENCOUNTER — Telehealth: Payer: Self-pay

## 2021-11-17 ENCOUNTER — Other Ambulatory Visit: Payer: Self-pay | Admitting: Medical

## 2021-11-17 LAB — HEMOGLOBIN A1C
Est. average glucose Bld gHb Est-mCnc: 237 mg/dL
Hgb A1c MFr Bld: 9.9 % — ABNORMAL HIGH (ref 4.8–5.6)

## 2021-11-17 NOTE — Telephone Encounter (Signed)
Lvm for pt to call back to make 4 month follow up appt. [place] ?

## 2022-01-11 ENCOUNTER — Encounter (INDEPENDENT_AMBULATORY_CARE_PROVIDER_SITE_OTHER): Payer: PRIVATE HEALTH INSURANCE | Admitting: Ophthalmology

## 2022-01-24 ENCOUNTER — Encounter (INDEPENDENT_AMBULATORY_CARE_PROVIDER_SITE_OTHER): Payer: PRIVATE HEALTH INSURANCE | Admitting: Ophthalmology

## 2022-02-07 ENCOUNTER — Encounter (INDEPENDENT_AMBULATORY_CARE_PROVIDER_SITE_OTHER): Payer: PRIVATE HEALTH INSURANCE | Admitting: Ophthalmology

## 2022-02-07 ENCOUNTER — Encounter (INDEPENDENT_AMBULATORY_CARE_PROVIDER_SITE_OTHER): Payer: Self-pay

## 2022-02-08 ENCOUNTER — Other Ambulatory Visit: Payer: Self-pay | Admitting: Medical

## 2022-03-21 ENCOUNTER — Encounter: Payer: Self-pay | Admitting: Internal Medicine

## 2022-04-05 ENCOUNTER — Ambulatory Visit (INDEPENDENT_AMBULATORY_CARE_PROVIDER_SITE_OTHER): Payer: PRIVATE HEALTH INSURANCE | Admitting: Medical

## 2022-04-05 VITALS — BP 120/82 | Wt 208.6 lb

## 2022-04-05 DIAGNOSIS — I10 Essential (primary) hypertension: Secondary | ICD-10-CM

## 2022-04-05 DIAGNOSIS — Z23 Encounter for immunization: Secondary | ICD-10-CM | POA: Diagnosis not present

## 2022-04-05 DIAGNOSIS — E118 Type 2 diabetes mellitus with unspecified complications: Secondary | ICD-10-CM | POA: Diagnosis not present

## 2022-04-05 DIAGNOSIS — Z7185 Encounter for immunization safety counseling: Secondary | ICD-10-CM | POA: Diagnosis not present

## 2022-04-05 DIAGNOSIS — E782 Mixed hyperlipidemia: Secondary | ICD-10-CM | POA: Diagnosis not present

## 2022-04-05 LAB — POCT GLYCOSYLATED HEMOGLOBIN (HGB A1C): Hemoglobin A1C: 9.7 % — AB (ref 4.0–5.6)

## 2022-04-05 MED ORDER — SOLIQUA 100-33 UNT-MCG/ML ~~LOC~~ SOPN: PEN_INJECTOR | SUBCUTANEOUS | 5 refills | Status: DC

## 2022-04-05 MED ORDER — METFORMIN HCL 1000 MG PO TABS: 1000.0000 mg | ORAL_TABLET | Freq: Two times a day (BID) | ORAL | 1 refills | Status: DC

## 2022-04-05 NOTE — Progress Notes (Signed)
Established Patient Office Visit  Subjective   Patient ID: Jeffrey Rodriguez, male    DOB: [birth year]  Age: 56 y.o. MRN: [id number]  Chief Complaint  Patient presents with   diabetes    Diabetes, flu shot today, no concerns   Here for med check  Diabetes-compliant with metformin 1000 mg twice daily, compliant with Soliqua 30 units daily.  He is monitoring blood sugars, but glucose is running 150-200 fasting.  No blurred vision, no polyuria, no polydipsia  Hypertension-compliant lisinopril HCT 20/12.5 mg daily.  No current concerns or symptoms  Hyperlipidemia-compliant Lipitor 20 mg daily  No other concerns, feels fine otherwise   Patient Active Problem List   Diagnosis Date Noted   Foot pain, right 06/23/2021   Needs flu shot 04/06/2021   Screen for colon cancer 04/06/2021   Polyp of colon 04/06/2021   Contact with and (suspected) exposure to covid-19 03/22/2021   Chronic right-sided low back pain with right-sided sciatica 01/18/2021   Screening for prostate cancer 06/17/2020   Varicose veins of both lower extremities 06/17/2020   High risk medication use 12/10/2019   Mixed dyslipidemia 02/19/2017   Vaccine counseling 02/19/2017   Onychomycosis 07/26/2016   Need for influenza vaccination 04/12/2016   Microalbuminuria 01/12/2016   Routine general medical examination at a health care facility 10/05/2015   Smoker 10/05/2015   Essential hypertension 10/05/2015   Diabetes mellitus with complication ([place]) 78/29/5621   Past Medical History:  Diagnosis Date   Diabetes mellitus without complication ([place]) 3086   GERD (gastroesophageal reflux disease)    Hyperlipidemia    Hypertension 2010   Microalbuminuria 11/2013   Mixed dyslipidemia    diet and exercise   Obesity    Smoker    0.5ppd   Wears glasses    Social History   Tobacco Use   Smoking status: Every Day    Packs/day: 0.50    Years: 26.00    Total pack years: 13.00    Types: Cigarettes   Smokeless tobacco:  Never  Vaping Use   Vaping Use: Never used  Substance Use Topics   Alcohol use: No    Alcohol/week: 0.0 standard drinks of alcohol   Drug use: No   Family Status  Relation Name Status   Father  Deceased   Mother  Alive   Brother  Alive   Other  (Not Specified)   Neg Hx  (Not Specified)   No Known Allergies  Review of Systems  Eyes:  Negative for blurred vision.  Respiratory:  Negative for shortness of breath.   Cardiovascular:  Negative for chest pain.  Neurological:  Negative for headaches.      Objective:    BP 120/82   Wt 208 lb 9.6 oz (94.6 kg)   BMI 29.51 kg/m   Wt Readings from Last 3 Encounters:  04/05/22 208 lb 9.6 oz (94.6 kg)  11/16/21 208 lb 9.6 oz (94.6 kg)  06/23/21 215 lb 6.4 oz (97.7 kg)   Gen: Well-developed well-nourished no acute distress Heart regular rate rhythm, normal S1-S2, no murmurs Lungs clear      Assessment & Plan:   Encounter Diagnoses  Name Primary?   Diabetes mellitus with complication ([place]) Yes   Needs flu shot    Vaccine counseling    Mixed dyslipidemia    Essential hypertension    Diabetes Patient Instructions  Recommendations: Continue Metformin '100mg'$  twice daily.  New prescription sent today Continue Soliqua injection.  You are currently doing 30 unites daily  Increase to 33 units daily through Sunday On Monday go up to 35 units daily.  Stay at 35 units daily for 1 week.  If blood sugars fasting in the morning are not under 130 regularly after 1 week, then go up to 37 units daily Stay at 37 units daily for  1 week.  Continue to monitor fasting sugars with goal less than 130. You can continue to increase the Soliqua units 2 units every week until glucose is consistently less than 130 in the morning  Continue to avoid high sugar foods.   Avoid soda, sweet tea, alcohol, candy and junk food.  Avoid large portions of rice, pasta, bread or potatoes.        Hypertension-continue current medication, lisinopril HCT  20/12.5 mg daily  Hyperlipidemia -  continue current medication Lipitor 20 mg daily  Counseled on the influenza virus vaccine.  Vaccine information sheet given.  Influenza vaccine given after consent obtained.   Ruel was seen today for diabetes.  Diagnoses and all orders for this visit:  Diabetes mellitus with complication ([place]) -     HgB A1c  Needs flu shot -     Flu Vaccine QUAD 29moIM (Fluarix, Fluzone & Alfiuria Quad PF)  Vaccine counseling  Mixed dyslipidemia  Essential hypertension  Other orders -     metFORMIN (GLUCOPHAGE) 1000 MG tablet; Take 1 tablet (1,000 mg total) by mouth 2 (two) times daily with a meal. -     Insulin Glargine-Lixisenatide (SOLIQUA) 100-33 UNT-MCG/ML SOPN; INJECT 40 UNITS SUBCUTANEOUSLY ONCE DAILY   F/u 338mo

## 2022-04-05 NOTE — Patient Instructions (Signed)
Recommendations: Continue Metformin '100mg'$  twice daily.  New prescription sent today Continue Soliqua injection.  You are currently doing 30 unites daily Increase to 33 units daily through Sunday On Monday go up to 35 units daily.  Stay at 35 units daily for 1 week.  If blood sugars fasting in the morning are not under 130 regularly after 1 week, then go up to 37 units daily Stay at 37 units daily for  1 week.  Continue to monitor fasting sugars with goal less than 130. You can continue to increase the Soliqua units 2 units every week until glucose is consistently less than 130 in the morning  Continue to avoid high sugar foods.   Avoid soda, sweet tea, alcohol, candy and junk food.  Avoid large portions of rice, pasta, bread or potatoes.

## 2022-04-24 ENCOUNTER — Encounter: Payer: Self-pay | Admitting: Internal Medicine

## 2022-04-26 ENCOUNTER — Encounter (INDEPENDENT_AMBULATORY_CARE_PROVIDER_SITE_OTHER): Payer: PRIVATE HEALTH INSURANCE | Admitting: Ophthalmology

## 2022-04-26 DIAGNOSIS — I10 Essential (primary) hypertension: Secondary | ICD-10-CM

## 2022-04-26 DIAGNOSIS — H353132 Nonexudative age-related macular degeneration, bilateral, intermediate dry stage: Secondary | ICD-10-CM

## 2022-04-26 DIAGNOSIS — H34831 Tributary (branch) retinal vein occlusion, right eye, with macular edema: Secondary | ICD-10-CM | POA: Diagnosis not present

## 2022-04-26 DIAGNOSIS — H35033 Hypertensive retinopathy, bilateral: Secondary | ICD-10-CM

## 2022-04-26 DIAGNOSIS — H35711 Central serous chorioretinopathy, right eye: Secondary | ICD-10-CM | POA: Diagnosis not present

## 2022-04-26 DIAGNOSIS — H43813 Vitreous degeneration, bilateral: Secondary | ICD-10-CM

## 2022-06-28 ENCOUNTER — Encounter: Payer: PRIVATE HEALTH INSURANCE | Admitting: Medical

## 2022-07-18 ENCOUNTER — Other Ambulatory Visit: Payer: Self-pay | Admitting: Medical

## 2022-08-09 ENCOUNTER — Ambulatory Visit: Payer: PRIVATE HEALTH INSURANCE | Admitting: Medical

## 2022-08-09 ENCOUNTER — Encounter: Payer: Self-pay | Admitting: Medical

## 2022-08-09 VITALS — BP 130/92 | HR 81 | Temp 97.3°F | Ht 70.0 in | Wt 212.0 lb

## 2022-08-09 DIAGNOSIS — F17200 Nicotine dependence, unspecified, uncomplicated: Secondary | ICD-10-CM

## 2022-08-09 DIAGNOSIS — R809 Proteinuria, unspecified: Secondary | ICD-10-CM

## 2022-08-09 DIAGNOSIS — Z125 Encounter for screening for malignant neoplasm of prostate: Secondary | ICD-10-CM | POA: Diagnosis not present

## 2022-08-09 DIAGNOSIS — E118 Type 2 diabetes mellitus with unspecified complications: Secondary | ICD-10-CM

## 2022-08-09 DIAGNOSIS — Z7185 Encounter for immunization safety counseling: Secondary | ICD-10-CM | POA: Diagnosis not present

## 2022-08-09 DIAGNOSIS — I10 Essential (primary) hypertension: Secondary | ICD-10-CM | POA: Diagnosis not present

## 2022-08-09 DIAGNOSIS — I8393 Asymptomatic varicose veins of bilateral lower extremities: Secondary | ICD-10-CM

## 2022-08-09 DIAGNOSIS — M5441 Lumbago with sciatica, right side: Secondary | ICD-10-CM

## 2022-08-09 DIAGNOSIS — Z0000 Encounter for general adult medical examination without abnormal findings: Secondary | ICD-10-CM

## 2022-08-09 DIAGNOSIS — Z1211 Encounter for screening for malignant neoplasm of colon: Secondary | ICD-10-CM

## 2022-08-09 DIAGNOSIS — K635 Polyp of colon: Secondary | ICD-10-CM

## 2022-08-09 DIAGNOSIS — G8929 Other chronic pain: Secondary | ICD-10-CM

## 2022-08-09 DIAGNOSIS — E782 Mixed hyperlipidemia: Secondary | ICD-10-CM

## 2022-08-09 LAB — POCT URINALYSIS DIP (PROADVANTAGE DEVICE)
Bilirubin, UA: NEGATIVE
Blood, UA: NEGATIVE
Glucose, UA: >=1000 mg/dL — AB
Ketones, POC UA: NEGATIVE mg/dL
Leukocytes, UA: NEGATIVE
Nitrite, UA: NEGATIVE
Specific Gravity, Urine: 1.025
Urobilinogen, Ur: 0.2
pH, UA: 6 (ref 5.0–8.0)

## 2022-08-09 NOTE — Progress Notes (Signed)
Subjective:   HPI  Jeffrey Rodriguez is a 57 y.o. male who presents for Chief Complaint  Patient presents with   Annual Exam    Fasting. Needs form filled out for work.     Patient Care Team: Aydia Maj, Leward Quan as PCP - General (Family Medicine) Sees dentist Sees eye doctor, Dr. Zigmund Daniel, Dr. Benay Spice (retinal specialist) Dr. Wilfrid Lund, GI Dr. Fransico Him, cardiology Dr. Lanae Crumbly, podiatry   Concerns: Here for yearly well visit  Not checking home BPs.    Compliant with all medications.  Still smoking.    Reviewed their medical, surgical, family, social, medication, and allergy history and updated chart as appropriate.  Past Medical History:  Diagnosis Date   Diabetes mellitus without complication ([place]) 4010   GERD (gastroesophageal reflux disease)    Hyperlipidemia    Hypertension 2010   Microalbuminuria 11/2013   Mixed dyslipidemia    diet and exercise   Obesity    Smoker    0.5ppd   Wears glasses     Past Surgical History:  Procedure Laterality Date   COLONOSCOPY  12/2015   tubular adenoma, Dr. Wilfrid Lund   WISDOM TOOTH EXTRACTION      Family History  Problem Relation Age of Onset   Pneumonia Father    Arthritis Mother    Depression Brother    Hypertension Other    Cancer Neg Hx    Stroke Neg Hx    Colon cancer Neg Hx    Colon polyps Neg Hx    Esophageal cancer Neg Hx    Rectal cancer Neg Hx    Stomach cancer Neg Hx    Heart disease Neg Hx    Diabetes Neg Hx      Current Outpatient Medications:    aspirin EC (ECOTRIN LOW STRENGTH) 81 MG tablet, TAKE 1 TABLET  BY MOUTH DAILY . SWALLOW WHOLE, Disp: 30 tablet, Rfl: 0   atorvastatin (LIPITOR) 20 MG tablet, Take 1 tablet by mouth once daily, Disp: 30 tablet, Rfl: 0   blood glucose meter kit and supplies KIT, Inject 1 each into the skin as directed. Dispense based on patient and insurance preference. Use up to four times daily as directed, Disp: 100 each, Rfl: 3   Insulin  Glargine-Lixisenatide (SOLIQUA) 100-33 UNT-MCG/ML SOPN, INJECT 40 UNITS SUBCUTANEOUSLY ONCE DAILY (Patient taking differently: Inject 36 Units into the skin daily. INJECT 36 UNITS SUBCUTANEOUSLY ONCE DAILY), Disp: 15 mL, Rfl: 5   Insulin Pen Needle (BD PEN NEEDLE NANO 2ND GEN) 32G X 4 MM MISC, USE 1  AT BEDTIME, Disp: 100 each, Rfl: 0   lisinopril-hydrochlorothiazide (ZESTORETIC) 20-12.5 MG tablet, Take 1 tablet by mouth once daily, Disp: 30 tablet, Rfl: 0   metFORMIN (GLUCOPHAGE) 1000 MG tablet, Take 1 tablet (1,000 mg total) by mouth 2 (two) times daily with a meal., Disp: 180 tablet, Rfl: 1  No Known Allergies     Review of Systems Constitutional: -fever, -chills, -sweats, -unexpected weight change, -decreased appetite, -fatigue Allergy: -sneezing, -itching, -congestion Dermatology: -changing moles, --rash, -lumps ENT: -runny nose, -ear pain, -sore throat, -hoarseness, -sinus pain, -teeth pain, - ringing in ears, -hearing loss, -nosebleeds Cardiology: -chest pain, -palpitations, -swelling, -difficulty breathing when lying flat, -waking up short of breath Respiratory: -cough, -shortness of breath, -difficulty breathing with exercise or exertion, -wheezing, -coughing up blood Gastroenterology: -abdominal pain, -nausea, -vomiting, -diarrhea, -constipation, -blood in stool, -changes in bowel movement, -difficulty swallowing or eating Hematology: -bleeding, -bruising  Musculoskeletal: -joint aches, -muscle aches, -  joint swelling, -back pain, -neck pain, -cramping, -changes in gait Ophthalmology: denies vision changes, eye redness, itching, discharge Urology: -burning with urination, -difficulty urinating, -blood in urine, -urinary frequency, -urgency, -incontinence Neurology: -headache, -weakness, -tingling, -numbness, -memory loss, -falls, -dizziness Psychology: -depressed mood, -agitation, -sleep problems Male GU: no testicular mass, pain, no lymph nodes swollen, no swelling, no rash.      Objective:  BP (!) 130/92   Pulse 81   Temp (!) 97.3 F (36.3 C) (Tympanic)   Ht '5\' 10"'$  (1.778 m)   Wt 212 lb (96.2 kg)   SpO2 99% Comment: room air  BMI 30.42 kg/m   Wt Readings from Last 3 Encounters:  08/09/22 212 lb (96.2 kg)  04/05/22 208 lb 9.6 oz (94.6 kg)  11/16/21 208 lb 9.6 oz (94.6 kg)   BP Readings from Last 3 Encounters:  08/09/22 (!) 130/92  04/05/22 120/82  11/16/21 124/78   General appearance: alert, no distress, WD/WN, African American male Skin: unremarkable HEENT: normocephalic, conjunctiva/corneas normal, sclerae anicteric, PERRLA, EOMi, nares patent, no discharge or erythema, pharynx normal Oral cavity: MMM, tongue normal, teeth in good repair Neck: supple, no lymphadenopathy, no thyromegaly, no masses, normal ROM, no bruits Chest: non tender, normal shape and expansion Heart: RRR, normal S1, S2, no murmurs Lungs: CTA bilaterally, no wheezes, rhonchi, or rales Abdomen: +bs, soft, non tender, non distended, no masses, no hepatomegaly, no splenomegaly, no bruits Back: non tender, normal ROM, no scoliosis Musculoskeletal: upper extremities non tender, no obvious deformity, normal ROM throughout, lower extremities non tender, no obvious deformity, normal ROM throughout Extremities: mild varicose veins, bilat, no edema, no cyanosis, no clubbing Pulses: 2+ symmetric, upper and lower extremities, normal cap refill Neurological: alert, oriented x 3, CN2-12 intact, strength normal upper extremities and lower extremities, sensation normal throughout, DTRs 2+ throughout, no cerebellar signs, gait normal Psychiatric: normal affect, behavior normal, pleasant  GU: normal male external genitalia,circumcised, nontender, no masses, no hernia, no lymphadenopathy Rectal: declines  EKG reviewed, nsr with 1st degree AV block   Diabetic Foot Exam - Simple   Simple Foot Form Diabetic Foot exam was performed with the following findings: Yes 08/09/2022 10:02 AM  Visual  Inspection No deformities, no ulcerations, no other skin breakdown bilaterally: Yes Sensation Testing Intact to touch and monofilament testing bilaterally: Yes Pulse Check Posterior Tibialis and Dorsalis pulse intact bilaterally: Yes Comments Somewhat thickened toenails especially the great toenails yellowish coloration throughout but no significant hypertrophy      Assessment and Plan :   Encounter Diagnoses  Name Primary?   Routine general medical examination at a health care facility Yes   Screen for colon cancer    Screening for prostate cancer    Vaccine counseling    Smoker    Essential hypertension    Polyp of colon, unspecified part of colon, unspecified type    Varicose veins of both lower extremities, unspecified whether complicated    Diabetes mellitus with complication ([place])    Chronic right-sided low back pain with right-sided sciatica    Microalbuminuria    Mixed dyslipidemia      Today you had a preventative care visit or wellness visit.    Topics today may have included healthy lifestyle, diet, exercise, preventative care, vaccinations, sick and well care, proper use of emergency dept and after hours care, as well as other concerns.     Recommendations: Continue to return yearly for your annual wellness and preventative care visits.  This gives [place] a chance to  discuss healthy lifestyle, exercise, vaccinations, review your chart record, and perform screenings where appropriate.  I recommend you see your eye doctor yearly for routine vision care.  I recommend you see your dentist yearly for routine dental care including hygiene visits twice yearly.   Vaccination recommendations were reviewed Immunization History  Administered Date(s) Administered   Influenza,inj,Quad PF,6+ Mos 03/11/2014, 04/12/2016, 03/27/2018, 04/29/2019, 04/07/2020, 04/06/2021, 04/05/2022   Influenza-Unspecified 03/17/2015, 03/27/2017   PFIZER(Purple Top)SARS-COV-2 Vaccination  11/14/2019, 12/05/2019, 06/27/2020   PNEUMOCOCCAL CONJUGATE-20 06/23/2021   Pneumococcal Polysaccharide-23 11/19/2013   Tdap 01/12/2016   Zoster Recombinat (Shingrix) 10/26/2020, 12/29/2020     Screening for cancer: Colon cancer screening:  I reviewed your colonoscopy on file that is up to date from 2017 New referral placed for colon screening, never heard back from referral last year  We discussed PSA, prostate exam, and prostate cancer screening risks/benefits.     Skin cancer screening: Check your skin regularly for new changes, growing lesions, or other lesions of concern Come in for evaluation if you have skin lesions of concern.  Lung cancer screening: If you have a greater than 30 pack year history of tobacco use, then you qualify for lung cancer screening with a chest CT scan  We currently don't have screenings for other cancers besides breast, cervical, colon, and lung cancers.  If you have a strong family history of cancer or have other cancer screening concerns, please let me know.    Bone health: Get at least 150 minutes of aerobic exercise weekly Get weight bearing exercise at least once weekly   Heart health/vascular health: Get at least 150 minutes of aerobic exercise weekly Limit alcohol It is important to maintain a healthy blood pressure and healthy cholesterol numbers Reviewed 2019 exercise stress test which was reassuring  EKG reviewed today  Consider updated ABI blood flow screens of legs and aortic aneurysm screen of abdomen.  Consider CT coronary heart disease test.  If agreeable to any of these, let me know and we will refer.    Separate significant issues discussed: Hypertension-continue current medication  Hyperlipidemia-continue current medication, Lisinopril HCT 20/12.'5mg'$  daily  Diabetes-continue current medication.   Continue Soliqua and Metformin '1000mg'$  BID  Advise smoking cessation, he doesn't seem to be motivated to quit.  Your lungs  sounds do not sound normal, I strongly recommend you quit tobacco!!!  Onychomycosis - consider treatment.     Erma was seen today for annual exam.  Diagnoses and all orders for this visit:  Routine general medical examination at a health care facility -     Ambulatory referral to Gastroenterology -     Comprehensive metabolic panel -     CBC -     Lipid panel -     PSA -     POCT Urinalysis DIP (Proadvantage Device) -     Hemoglobin A1c -     Microalbumin/Creatinine Ratio, Urine -     EKG 12-Lead  Screen for colon cancer  Screening for prostate cancer -     PSA  Vaccine counseling  Smoker  Essential hypertension -     EKG 12-Lead  Polyp of colon, unspecified part of colon, unspecified type  Varicose veins of both lower extremities, unspecified whether complicated  Diabetes mellitus with complication ([facility]) -     Hemoglobin A1c -     Microalbumin/Creatinine Ratio, Urine -     EKG 12-Lead  Chronic right-sided low back pain with right-sided sciatica  Microalbuminuria -  Hemoglobin A1c -     Microalbumin/Creatinine Ratio, Urine  Mixed dyslipidemia -     Lipid panel      Follow-up pending labs, yearly for physical

## 2022-08-10 ENCOUNTER — Telehealth: Payer: Self-pay | Admitting: Medical

## 2022-08-10 ENCOUNTER — Other Ambulatory Visit: Payer: Self-pay | Admitting: Medical

## 2022-08-10 MED ORDER — ATORVASTATIN CALCIUM 20 MG PO TABS: 20.0000 mg | ORAL_TABLET | Freq: Every day | ORAL | 3 refills | Status: DC

## 2022-08-10 MED ORDER — SOLIQUA 100-33 UNT-MCG/ML ~~LOC~~ SOPN: 42.0000 [IU] | PEN_INJECTOR | Freq: Every day | SUBCUTANEOUS | 1 refills | Status: DC

## 2022-08-10 MED ORDER — BD PEN NEEDLE NANO 2ND GEN 32G X 4 MM MISC: 3 refills | Status: DC

## 2022-08-10 MED ORDER — METFORMIN HCL 1000 MG PO TABS: 1000.0000 mg | ORAL_TABLET | Freq: Two times a day (BID) | ORAL | 1 refills | Status: DC

## 2022-08-10 MED ORDER — LISINOPRIL-HYDROCHLOROTHIAZIDE 20-12.5 MG PO TABS: 1.0000 | ORAL_TABLET | Freq: Every day | ORAL | 3 refills | Status: DC

## 2022-08-10 MED ORDER — ASPIRIN 81 MG PO TBEC: DELAYED_RELEASE_TABLET | ORAL | 3 refills | Status: DC

## 2022-08-10 NOTE — Telephone Encounter (Signed)
Please advise on the directions. How many units

## 2022-08-10 NOTE — Telephone Encounter (Signed)
Please call re [facility] directions   they received 2 different instructions   Insulin Glargine-Lixisenatide (SOLIQUA) 100-33 UNT-MCG/ML SOPN 15 mL 1 08/10/2022    Sig - Route: Inject 42 Units into the skin daily. INJECT 36 UNITS SUBCUTANEOUSLY ONCE DAILY - Subcutaneous   Sent to pharmacy as: Insulin Glargine-Lixisenatide (SOLIQUA) 100-33 UNT-MCG/ML Solution Pen-injector   E-Prescribing Status: Receipt confirmed by pharmacy (08/10/2022  1:29 PM EST)    Pharmacy

## 2022-08-10 NOTE — Progress Notes (Signed)
Results sent through MyChart

## 2022-08-11 LAB — COMPREHENSIVE METABOLIC PANEL
ALT: 28 IU/L (ref 0–44)
AST: 23 IU/L (ref 0–40)
Albumin/Globulin Ratio: 1.4 (ref 1.2–2.2)
Albumin: 4.3 g/dL (ref 3.8–4.9)
Alkaline Phosphatase: 102 IU/L (ref 44–121)
BUN/Creatinine Ratio: 10 (ref 9–20)
BUN: 11 mg/dL (ref 6–24)
Bilirubin Total: 0.4 mg/dL (ref 0.0–1.2)
CO2: 27 mmol/L (ref 20–29)
Calcium: 9.8 mg/dL (ref 8.7–10.2)
Chloride: 101 mmol/L (ref 96–106)
Creatinine, Ser: 1.11 mg/dL (ref 0.76–1.27)
Globulin, Total: 3 g/dL (ref 1.5–4.5)
Glucose: 167 mg/dL — ABNORMAL HIGH (ref 70–99)
Potassium: 3.8 mmol/L (ref 3.5–5.2)
Sodium: 144 mmol/L (ref 134–144)
Total Protein: 7.3 g/dL (ref 6.0–8.5)
eGFR: 77 mL/min/{1.73_m2} (ref >59)

## 2022-08-11 LAB — CBC
Hematocrit: 46.7 % (ref 37.5–51.0)
Hemoglobin: 15.1 g/dL (ref 13.0–17.7)
MCH: 27.4 pg (ref 26.6–33.0)
MCHC: 32.3 g/dL (ref 31.5–35.7)
MCV: 85 fL (ref 79–97)
Platelets: 211 10*3/uL (ref 150–450)
RBC: 5.51 x10E6/uL (ref 4.14–5.80)
RDW: 13.2 % (ref 11.6–15.4)
WBC: 5 10*3/uL (ref 3.4–10.8)

## 2022-08-11 LAB — LIPID PANEL
Chol/HDL Ratio: 2.2 ratio (ref 0.0–5.0)
Cholesterol, Total: 93 mg/dL — ABNORMAL LOW (ref 100–199)
HDL: 42 mg/dL (ref >39)
LDL Chol Calc (NIH): 36 mg/dL (ref 0–99)
Triglycerides: 72 mg/dL (ref 0–149)
VLDL Cholesterol Cal: 15 mg/dL (ref 5–40)

## 2022-08-11 LAB — PSA: Prostate Specific Ag, Serum: 1.2 ng/mL (ref 0.0–4.0)

## 2022-08-11 LAB — HEMOGLOBIN A1C
Est. average glucose Bld gHb Est-mCnc: 192 mg/dL
Hgb A1c MFr Bld: 8.3 % — ABNORMAL HIGH (ref 4.8–5.6)

## 2022-08-11 LAB — MICROALBUMIN / CREATININE URINE RATIO
Creatinine, Urine: 241.5 mg/dL
Microalb/Creat Ratio: 20 mg/g creat (ref 0–29)
Microalbumin, Urine: 47.1 ug/mL

## 2022-08-13 ENCOUNTER — Other Ambulatory Visit: Payer: Self-pay | Admitting: Medical

## 2022-08-13 MED ORDER — EMPAGLIFLOZIN 10 MG PO TABS: 10.0000 mg | ORAL_TABLET | Freq: Every day | ORAL | 0 refills | Status: DC

## 2022-08-13 NOTE — Progress Notes (Signed)
Microalbumin kidney marker okay.  In addition to the aforementioned recommendations, see if agreeable to adding a medicine like Jardiance once daily to see if we can further get sugars under control.  This medicine also would help with heart disease prevention.

## 2022-08-19 ENCOUNTER — Encounter (HOSPITAL_COMMUNITY): Payer: Self-pay | Admitting: *Deleted

## 2022-08-19 ENCOUNTER — Ambulatory Visit (HOSPITAL_COMMUNITY)
Admission: EM | Admit: 2022-08-19 | Discharge: 2022-08-19 | Disposition: A | Discharge status: Home / Self Care | Payer: PRIVATE HEALTH INSURANCE | Attending: Physician Assistant | Admitting: Physician Assistant

## 2022-08-19 ENCOUNTER — Other Ambulatory Visit: Payer: Self-pay

## 2022-08-19 DIAGNOSIS — K047 Periapical abscess without sinus: Secondary | ICD-10-CM

## 2022-08-19 DIAGNOSIS — K0889 Other specified disorders of teeth and supporting structures: Secondary | ICD-10-CM | POA: Diagnosis not present

## 2022-08-19 MED ORDER — PENICILLIN V POTASSIUM 500 MG PO TABS: 500.0000 mg | ORAL_TABLET | Freq: Four times a day (QID) | ORAL | 0 refills | Status: AC

## 2022-08-19 NOTE — Discharge Instructions (Signed)
Advised to take the penicillin 500 mg every 6 hours on a regular basis until completed as this will treat the infection. Advised to continue to use ibuprofen 600 mg every 6-8 hours with food to help reduce pain and discomfort. Advise use salt water gargles on a regular basis to help soothe the discomfort.  Advised to keep the dental appointment on August 30, 2022 to have the area evaluated and treated.  Advised to return to urgent care as needed.

## 2022-08-19 NOTE — ED Triage Notes (Signed)
Pt reports dental infection and pain on Lt upper tooth. Pt has appt.for tooth removal on 08-30-22.

## 2022-08-19 NOTE — ED Provider Notes (Signed)
[place]    CSN: [id number] Arrival date & time: 08/19/22  1114      History   Chief Complaint Chief Complaint  Patient presents with   Dental Problem    HPI Jeffrey Rodriguez is a 57 y.o. male.   57 year old male presents with left upper tooth pain.  Patient indicates for the past couple weeks he has been having increasing left upper tooth pain, soreness, tenderness with swelling.  He relates that is sensitive to hot and cold.  He indicates he is not able to chew on the left side.  He is using some warm salt water gargles along with ibuprofen and Naprosyn which tends to help decrease the pain.  He indicates he does have a dental appointment on February 15 to have the area evaluated and treated.  He indicates that he has swelling at the site without drainage.  He is without fever or chills.  He is a diabetic.     Past Medical History:  Diagnosis Date   Diabetes mellitus without complication ([place]) 1194   GERD (gastroesophageal reflux disease)    Hyperlipidemia    Hypertension 2010   Microalbuminuria 11/2013   Mixed dyslipidemia    diet and exercise   Obesity    Smoker    0.5ppd   Wears glasses     Patient Active Problem List   Diagnosis Date Noted   Needs flu shot 04/06/2021   Screen for colon cancer 04/06/2021   Polyp of colon 04/06/2021   Chronic right-sided low back pain with right-sided sciatica 01/18/2021   Screening for prostate cancer 06/17/2020   Varicose veins of both lower extremities 06/17/2020   High risk medication use 12/10/2019   Mixed dyslipidemia 02/19/2017   Vaccine counseling 02/19/2017   Onychomycosis 07/26/2016   Microalbuminuria 01/12/2016   Routine general medical examination at a health care facility 10/05/2015   Smoker 10/05/2015   Essential hypertension 10/05/2015   Diabetes mellitus with complication ([place]) 17/40/8144    Past Surgical History:  Procedure Laterality Date   COLONOSCOPY  12/2015   tubular adenoma, Dr. Wilfrid Lund   WISDOM TOOTH EXTRACTION         Home Medications    Prior to Admission medications   Medication Sig Start Date End Date Taking? Authorizing Provider  penicillin v potassium (VEETID) 500 MG tablet Take 1 tablet (500 mg total) by mouth 4 (four) times daily for 10 days. 08/19/22 08/29/22 Yes Nyoka Lint, PA-C  aspirin EC (ECOTRIN LOW STRENGTH) 81 MG tablet TAKE 1 TABLET  BY MOUTH DAILY . SWALLOW WHOLE 08/10/22   Tysinger, Camelia Eng, PA-C  atorvastatin (LIPITOR) 20 MG tablet Take 1 tablet (20 mg total) by mouth daily. 08/10/22   Tysinger, Camelia Eng, PA-C  blood glucose meter kit and supplies KIT Inject 1 each into the skin as directed. Dispense based on patient and insurance preference. Use up to four times daily as directed 06/18/20   Tysinger, Camelia Eng, PA-C  empagliflozin (JARDIANCE) 10 MG TABS tablet Take 1 tablet (10 mg total) by mouth daily before breakfast. 08/13/22   Tysinger, Camelia Eng, PA-C  Insulin Glargine-Lixisenatide (SOLIQUA) 100-33 UNT-MCG/ML SOPN Inject 42 Units into the skin daily. INJECT 42 UNITS SUBCUTANEOUSLY ONCE DAILY 08/10/22   Tysinger, Camelia Eng, PA-C  Insulin Pen Needle (BD PEN NEEDLE NANO 2ND GEN) 32G X 4 MM MISC USE 1  AT BEDTIME 08/10/22   Tysinger, Camelia Eng, PA-C  lisinopril-hydrochlorothiazide (ZESTORETIC) 20-12.5 MG tablet Take 1 tablet by mouth  daily. 08/10/22   Tysinger, Camelia Eng, PA-C  metFORMIN (GLUCOPHAGE) 1000 MG tablet Take 1 tablet (1,000 mg total) by mouth 2 (two) times daily with a meal. 08/10/22 08/10/23  Tysinger, Camelia Eng, PA-C    Family History Family History  Problem Relation Age of Onset   Pneumonia Father    Arthritis Mother    Depression Brother    Hypertension Other    Cancer Neg Hx    Stroke Neg Hx    Colon cancer Neg Hx    Colon polyps Neg Hx    Esophageal cancer Neg Hx    Rectal cancer Neg Hx    Stomach cancer Neg Hx    Heart disease Neg Hx    Diabetes Neg Hx     Social History Social History   Tobacco Use   Smoking status: Every Day     Packs/day: 0.50    Years: 27.00    Total pack years: 13.50    Types: Cigarettes   Smokeless tobacco: Never  Vaping Use   Vaping Use: Never used  Substance Use Topics   Alcohol use: No    Alcohol/week: 0.0 standard drinks of alcohol   Drug use: No     Allergies   Patient has no known allergies.   Review of Systems Review of Systems  HENT:  Positive for dental problem (left upper molar).      Physical Exam Triage Vital Signs ED Triage Vitals  Enc Vitals Group     BP 08/19/22 1137 (!) 147/102     Pulse Rate 08/19/22 1137 84     Resp 08/19/22 1137 20     Temp 08/19/22 1137 98.5 F (36.9 C)     Temp src --      SpO2 08/19/22 1137 97 %     Weight --      Height --      Head Circumference --      Peak Flow --      Pain Score 08/19/22 1135 8     Pain Loc --      Pain Edu? --      Excl. in [place]? --    No data found.  Updated Vital Signs BP (!) 147/102   Pulse 84   Temp 98.5 F (36.9 C)   Resp 20   SpO2 97%   Visual Acuity Right Eye Distance:   Left Eye Distance:   Bilateral Distance:    Right Eye Near:   Left Eye Near:    Bilateral Near:     Physical Exam Constitutional:      Appearance: Normal appearance.  HENT:     Mouth/Throat:     Mouth: Mucous membranes are moist.     Pharynx: Oropharynx is clear.      Comments: Mouth: Left upper molar with mild swelling of the gumline, tenderness on palpation externally at the site along the [place]. There is no active drainage or streaking noted. Neurological:     Mental Status: He is alert.      UC Treatments / Results  Labs (all labs ordered are listed, but only abnormal results are displayed) Labs Reviewed - No data to display  EKG   Radiology No results found.  Procedures Procedures (including critical care time)  Medications Ordered in UC Medications - No data to display  Initial Impression / Assessment and Plan / UC Course  I have reviewed the triage vital signs and the nursing  notes.  Pertinent labs & imaging  results that were available during my care of the patient were reviewed by me and considered in my medical decision making (see chart for details).    Plan: The diagnosis will be treated with the following: 1.  Dental pain: A.  Advised to use ibuprofen 600 mg every 6-8 hours with food to help reduce pain and swelling. B.  Advised to continue with Epsom salt gargles to help reduce pain and swelling. 2.  Dental abscess: A.  Penicillin VK 500 mg every 6 hours to treat the infection. 3.  Advised to follow-up with dentist and keep appointment on 08/30/2022 to have the area evaluated and treated. 4.  Advised to return to urgent care as needed. Final Clinical Impressions(s) / UC Diagnoses   Final diagnoses:  Pain, dental  Dental abscess     Discharge Instructions      Advised to take the penicillin 500 mg every 6 hours on a regular basis until completed as this will treat the infection. Advised to continue to use ibuprofen 600 mg every 6-8 hours with food to help reduce pain and discomfort. Advise use salt water gargles on a regular basis to help soothe the discomfort.  Advised to keep the dental appointment on August 30, 2022 to have the area evaluated and treated.  Advised to return to urgent care as needed.    ED Prescriptions     Medication Sig Dispense Auth. Provider   penicillin v potassium (VEETID) 500 MG tablet Take 1 tablet (500 mg total) by mouth 4 (four) times daily for 10 days. 40 tablet Nyoka Lint, PA-C      PDMP not reviewed this encounter.   Nyoka Lint, PA-C 08/19/22 1203

## 2022-08-23 ENCOUNTER — Encounter (INDEPENDENT_AMBULATORY_CARE_PROVIDER_SITE_OTHER): Payer: PRIVATE HEALTH INSURANCE | Admitting: Ophthalmology

## 2022-09-20 ENCOUNTER — Encounter (INDEPENDENT_AMBULATORY_CARE_PROVIDER_SITE_OTHER): Payer: PRIVATE HEALTH INSURANCE | Admitting: Ophthalmology

## 2022-11-08 ENCOUNTER — Other Ambulatory Visit: Payer: Self-pay

## 2022-11-08 ENCOUNTER — Encounter: Payer: Self-pay | Admitting: Medical

## 2022-11-08 ENCOUNTER — Telehealth: Payer: Self-pay | Admitting: Medical

## 2022-11-08 ENCOUNTER — Ambulatory Visit: Payer: PRIVATE HEALTH INSURANCE | Admitting: Medical

## 2022-11-08 ENCOUNTER — Other Ambulatory Visit (HOSPITAL_COMMUNITY): Payer: Self-pay

## 2022-11-08 VITALS — BP 132/86 | HR 77 | Wt 210.6 lb

## 2022-11-08 DIAGNOSIS — R809 Proteinuria, unspecified: Secondary | ICD-10-CM

## 2022-11-08 DIAGNOSIS — E118 Type 2 diabetes mellitus with unspecified complications: Secondary | ICD-10-CM

## 2022-11-08 DIAGNOSIS — I10 Essential (primary) hypertension: Secondary | ICD-10-CM | POA: Diagnosis not present

## 2022-11-08 DIAGNOSIS — E782 Mixed hyperlipidemia: Secondary | ICD-10-CM | POA: Diagnosis not present

## 2022-11-08 MED ORDER — TIRZEPATIDE 7.5 MG/0.5ML ~~LOC~~ SOAJ
7.5000 mg | SUBCUTANEOUS | 1 refills | Status: DC
  Filled 2022-11-08: qty 2, 28d supply, fill #0

## 2022-11-08 MED ORDER — TIRZEPATIDE 7.5 MG/0.5ML ~~LOC~~ SOAJ: 7.5000 mg | SUBCUTANEOUS | 1 refills | Status: DC

## 2022-11-08 NOTE — Telephone Encounter (Signed)
Done

## 2022-11-08 NOTE — Patient Instructions (Signed)
Your blood sugars are goal  I do not think Jeffrey Rodriguez is working as well as it should, and unfortunately last visit the Jardiance was not affordable  In the future, please always call back and let me know if there is an issue with a medicine not covered by insurance or too expensive.  If you do not call us back I have no way of knowing this.   Recommendations for today: Stop Soliqua Continue metformin 1000 mg twice daily Begin Mounjaro 7.5 mg weekly pen device.  This is to replace the [facility] I think you will see better improvement with this medication Be really strict with your diet, avoid sweets, junk food and large bread or pasta servings Check your sugars every morning fasting and write down the readings.  We want to get you down to close to 130 or less Check your sugars anytime you feel weak or dizzy or sweaty I want you to let me know within 1 to 2 weeks what your sugars are running.  So call back or send me a MyChart message within 2 weeks to let me know how the sugars are running after starting Mounjaro If you cannot get Mounjaro from the pharmacy today, then let me know right away You may need to have another medicine added in a few weeks but lets start with this combination You unfortunately did not qualify for the study I was inquiring about Continue the rest of your medicines as usual

## 2022-11-08 NOTE — Progress Notes (Signed)
Subjective:  Jeffrey Rodriguez is a 57 y.o. male who presents for Chief Complaint  Patient presents with   Diabetes     Here for diabetes f/u.   I saw him a few months ago for well visit.  At that time his A1c was not at goal.  He continued on metformin 1000 mg twice daily and Soliqua injection 42 units daily.  Last visit we added Jardiance was not able to get.  So he has been basically.  Lately blood sugars can go up into the 200s.  He has no other concerns or symptoms today.  He is compliant with his cholesterol and blood pressure medication.  He has no new symptoms or concerns otherwise.  No other aggravating or relieving factors.    No other c/o.  Past Medical History:  Diagnosis Date   Diabetes mellitus without complication 2010   GERD (gastroesophageal reflux disease)    Hyperlipidemia    Hypertension 2010   Microalbuminuria 11/2013   Mixed dyslipidemia    diet and exercise   Obesity    Smoker    0.5ppd   Wears glasses    Current Outpatient Medications on File Prior to Visit  Medication Sig Dispense Refill   aspirin EC (ECOTRIN LOW STRENGTH) 81 MG tablet TAKE 1 TABLET  BY MOUTH DAILY . SWALLOW WHOLE 90 tablet 3   atorvastatin (LIPITOR) 20 MG tablet Take 1 tablet (20 mg total) by mouth daily. 90 tablet 3   blood glucose meter kit and supplies KIT Inject 1 each into the skin as directed. Dispense based on patient and insurance preference. Use up to four times daily as directed 100 each 3   Insulin Pen Needle (BD PEN NEEDLE NANO 2ND GEN) 32G X 4 MM MISC USE 1  AT BEDTIME 100 each 3   lisinopril-hydrochlorothiazide (ZESTORETIC) 20-12.5 MG tablet Take 1 tablet by mouth daily. 90 tablet 3   metFORMIN (GLUCOPHAGE) 1000 MG tablet Take 1 tablet (1,000 mg total) by mouth 2 (two) times daily with a meal. 180 tablet 1   No current facility-administered medications on file prior to visit.    The following portions of the patient's history were reviewed and updated as appropriate:  allergies, current medications, past family history, past medical history, past social history, past surgical history and problem list.  ROS Otherwise as in subjective above   Objective: BP 132/86   Pulse 77   Wt 210 lb 9.6 oz (95.5 kg)   SpO2 99%   BMI 30.22 kg/m   General appearance: alert, no distress, well developed, well nourished    Assessment: Encounter Diagnoses  Name Primary?   Diabetes mellitus with complication Yes   Essential hypertension    Mixed dyslipidemia    Microalbuminuria      Plan: Unfortunately he did not start Jardiance last visit due to cost.  He also did not let me know about this.  He has continued on the same regimen that unfortunately was not working  He is compliant with the rest of his medicines.  We will make some changes today as below.  Patient Instructions  Your blood sugars are goal  I do not think Jeffrey Rodriguez is working as well as it should, and unfortunately last visit the Jardiance was not affordable  In the future, please always call back and let me know if there is an issue with a medicine not covered by insurance or too expensive.  If you do not call us back I have no way  of knowing this.   Recommendations for today: Stop Soliqua Continue metformin 1000 mg twice daily Begin Mounjaro 7.5 mg weekly pen device.  This is to replace the [facility] I think you will see better improvement with this medication Be really strict with your diet, avoid sweets, junk food and large bread or pasta servings Check your sugars every morning fasting and write down the readings.  We want to get you down to close to 130 or less Check your sugars anytime you feel weak or dizzy or sweaty I want you to let me know within 1 to 2 weeks what your sugars are running.  So call back or send me a MyChart message within 2 weeks to let me know how the sugars are running after starting Mounjaro If you cannot get Mounjaro from the pharmacy today, then let me know  right away You may need to have another medicine added in a few weeks but lets start with this combination You unfortunately did not qualify for the study I was inquiring about Continue the rest of your medicines as usual   Jeffrey Rodriguez was seen today for diabetes.  Diagnoses and all orders for this visit:  Diabetes mellitus with complication  Essential hypertension  Mixed dyslipidemia  Microalbuminuria  Other orders -     tirzepatide (MOUNJARO) 7.5 MG/0.5ML Pen; Inject 7.5 mg into the skin once a week.   Follow up: call report 1-2 weeks

## 2022-11-08 NOTE — Telephone Encounter (Signed)
Jeffrey Rodriguez wanted me to let you know that his insurance does cover mounjaro but his pharmacy is currently out of mounjaro and he is going to call around to other pharmacies and see who has it in stock.

## 2022-11-09 ENCOUNTER — Other Ambulatory Visit (HOSPITAL_COMMUNITY): Payer: Self-pay

## 2022-11-12 ENCOUNTER — Other Ambulatory Visit (HOSPITAL_COMMUNITY): Payer: Self-pay

## 2022-11-12 ENCOUNTER — Telehealth: Payer: Self-pay

## 2022-11-12 ENCOUNTER — Other Ambulatory Visit: Payer: Self-pay | Admitting: Medical

## 2022-11-12 DIAGNOSIS — E118 Type 2 diabetes mellitus with unspecified complications: Secondary | ICD-10-CM

## 2022-11-12 MED ORDER — TIRZEPATIDE 7.5 MG/0.5ML ~~LOC~~ SOAJ: 7.5000 mg | SUBCUTANEOUS | 1 refills | Status: DC

## 2022-11-12 NOTE — Telephone Encounter (Signed)
Does he need wegovy or mounjaro. Looks like you sent mounjaro in. Just want to clarify

## 2022-11-12 NOTE — Telephone Encounter (Signed)
Left message for pt to call me back 

## 2022-11-12 NOTE — Telephone Encounter (Signed)
Pt called to advise [facility] does not have 7.5 mounjaro in stock and it is on backorder. He stated he has called several [facility] pharmacies and they all do not have this in stock.

## 2022-11-13 NOTE — Telephone Encounter (Signed)
Left message for pt to contact [facility]

## 2022-11-14 ENCOUNTER — Telehealth: Payer: Self-pay | Admitting: Medical

## 2022-11-14 ENCOUNTER — Other Ambulatory Visit: Payer: Self-pay | Admitting: Medical

## 2022-11-14 DIAGNOSIS — E118 Type 2 diabetes mellitus with unspecified complications: Secondary | ICD-10-CM

## 2022-11-14 MED ORDER — TIRZEPATIDE 7.5 MG/0.5ML ~~LOC~~ SOAJ: 7.5000 mg | SUBCUTANEOUS | 1 refills | Status: DC

## 2022-11-14 NOTE — Telephone Encounter (Signed)
Sent to [facility]

## 2022-11-14 NOTE — Telephone Encounter (Signed)
Woodley stopped by and found the [employer] he needs at  [facility] - [place], [place] - [address]

## 2022-11-27 ENCOUNTER — Ambulatory Visit (HOSPITAL_COMMUNITY)
Admission: EM | Admit: 2022-11-27 | Discharge: 2022-11-27 | Disposition: A | Discharge status: Home / Self Care | Payer: PRIVATE HEALTH INSURANCE | Attending: Emergency Medicine | Admitting: Emergency Medicine

## 2022-11-27 ENCOUNTER — Other Ambulatory Visit: Payer: Self-pay

## 2022-11-27 ENCOUNTER — Encounter (HOSPITAL_COMMUNITY): Payer: Self-pay | Admitting: *Deleted

## 2022-11-27 DIAGNOSIS — R051 Acute cough: Secondary | ICD-10-CM | POA: Diagnosis not present

## 2022-11-27 DIAGNOSIS — J302 Other seasonal allergic rhinitis: Secondary | ICD-10-CM

## 2022-11-27 MED ORDER — CETIRIZINE HCL 10 MG PO TABS: 10.0000 mg | ORAL_TABLET | Freq: Every day | ORAL | 2 refills | Status: DC

## 2022-11-27 NOTE — ED Provider Notes (Signed)
[facility]    CSN: [id number] Arrival date & time: 11/27/22  1131     History   Chief Complaint Chief Complaint  Patient presents with   Cough    HPI Jeffrey Rodriguez is a 57 y.o. male.  Yesterday developed tactile fever, cough, sneezing Cough is dry. Some runny nose and congestion  Denies sore throat, shortness of breath, abd pain, NVD Has used over-the-counter cough medicine No known sick contacts  Past Medical History:  Diagnosis Date   Diabetes mellitus without complication ([facility]) 2010   GERD (gastroesophageal reflux disease)    Hyperlipidemia    Hypertension 2010   Microalbuminuria 11/2013   Mixed dyslipidemia    diet and exercise   Obesity    Smoker    0.5ppd   Wears glasses     Patient Active Problem List   Diagnosis Date Noted   Needs flu shot 04/06/2021   Screen for colon cancer 04/06/2021   Polyp of colon 04/06/2021   Chronic right-sided low back pain with right-sided sciatica 01/18/2021   Screening for prostate cancer 06/17/2020   Varicose veins of both lower extremities 06/17/2020   High risk medication use 12/10/2019   Mixed dyslipidemia 02/19/2017   Vaccine counseling 02/19/2017   Onychomycosis 07/26/2016   Microalbuminuria 01/12/2016   Routine general medical examination at a health care facility 10/05/2015   Smoker 10/05/2015   Essential hypertension 10/05/2015   Diabetes mellitus with complication ([facility]) 10/05/2015    Past Surgical History:  Procedure Laterality Date   COLONOSCOPY  12/2015   tubular adenoma, Dr. Amada Jupiter   WISDOM TOOTH EXTRACTION         Home Medications    Prior to Admission medications   Medication Sig Start Date End Date Taking? Authorizing Provider  aspirin EC (ECOTRIN LOW STRENGTH) 81 MG tablet TAKE 1 TABLET  BY MOUTH DAILY . SWALLOW WHOLE 08/10/22  Yes Tysinger, Kermit Balo, PA-C  atorvastatin (LIPITOR) 20 MG tablet Take 1 tablet (20 mg total) by mouth daily. 08/10/22  Yes Tysinger, Kermit Balo, PA-C   cetirizine (ZYRTEC ALLERGY) 10 MG tablet Take 1 tablet (10 mg total) by mouth daily. 11/27/22  Yes Trajon Rosete, Lurena Joiner, PA-C  lisinopril-hydrochlorothiazide (ZESTORETIC) 20-12.5 MG tablet Take 1 tablet by mouth daily. 08/10/22  Yes Tysinger, Kermit Balo, PA-C  metFORMIN (GLUCOPHAGE) 1000 MG tablet Take 1 tablet (1,000 mg total) by mouth 2 (two) times daily with a meal. 08/10/22 08/10/23 Yes Tysinger, Kermit Balo, PA-C  blood glucose meter kit and supplies KIT Inject 1 each into the skin as directed. Dispense based on patient and insurance preference. Use up to four times daily as directed 06/18/20   Tysinger, Kermit Balo, PA-C  Insulin Pen Needle (BD PEN NEEDLE NANO 2ND GEN) 32G X 4 MM MISC USE 1  AT BEDTIME 08/10/22   Tysinger, Kermit Balo, PA-C  tirzepatide [facility]) 7.5 MG/0.5ML Pen Inject 7.5 mg into the skin once a week. 11/14/22   Tysinger, Kermit Balo, PA-C    Family History Family History  Problem Relation Age of Onset   Arthritis Mother    Pneumonia Father    Depression Brother    Hypertension Other    Cancer Neg Hx    Stroke Neg Hx    Colon cancer Neg Hx    Colon polyps Neg Hx    Esophageal cancer Neg Hx    Rectal cancer Neg Hx    Stomach cancer Neg Hx    Heart disease Neg Hx    Diabetes  Neg Hx     Social History Social History   Tobacco Use   Smoking status: Every Day    Packs/day: 0.50    Years: 27.00    Additional pack years: 0.00    Total pack years: 13.50    Types: Cigarettes   Smokeless tobacco: Never  Vaping Use   Vaping Use: Never used  Substance Use Topics   Alcohol use: No    Alcohol/week: 0.0 standard drinks of alcohol   Drug use: No     Allergies   Patient has no known allergies.   Review of Systems Review of Systems As per HPI  Physical Exam Triage Vital Signs ED Triage Vitals  Enc Vitals Group     BP 11/27/22 1250 129/85     Pulse Rate 11/27/22 1250 78     Resp 11/27/22 1250 16     Temp 11/27/22 1250 98.9 F (37.2 C)     Temp Source 11/27/22 1250 Oral      SpO2 11/27/22 1250 96 %     Weight --      Height --      Head Circumference --      Peak Flow --      Pain Score 11/27/22 1251 0     Pain Loc --      Pain Edu? --      Excl. in [place]? --    No data found.  Updated Vital Signs BP 129/85   Pulse 78   Temp 98.9 F (37.2 C) (Oral)   Resp 16   SpO2 96%    Physical Exam Vitals and nursing note reviewed.  Constitutional:      General: He is not in acute distress. HENT:     Right Ear: Tympanic membrane and ear canal normal.     Left Ear: Tympanic membrane and ear canal normal.     Nose: No congestion or rhinorrhea.     Mouth/Throat:     Mouth: Mucous membranes are moist.     Pharynx: Oropharynx is clear. No posterior oropharyngeal erythema.     Comments: Mild drainage  Eyes:     Conjunctiva/sclera: Conjunctivae normal.  Cardiovascular:     Rate and Rhythm: Normal rate and regular rhythm.     Pulses: Normal pulses.     Heart sounds: Normal heart sounds.  Pulmonary:     Effort: Pulmonary effort is normal.     Breath sounds: Normal breath sounds.  Musculoskeletal:     Cervical back: Normal range of motion.  Lymphadenopathy:     Cervical: No cervical adenopathy.  Skin:    General: Skin is warm and dry.  Neurological:     Mental Status: He is alert and oriented to person, place, and time.     UC Treatments / Results  Labs (all labs ordered are listed, but only abnormal results are displayed) Labs Reviewed - No data to display  EKG  Radiology No results found.  Procedures Procedures   Medications Ordered in UC Medications - No data to display  Initial Impression / Assessment and Plan / UC Course  I have reviewed the triage vital signs and the nursing notes.  Pertinent labs & imaging results that were available during my care of the patient were reviewed by me and considered in my medical decision making (see chart for details).  Afebrile, well appearing, clear lungs Dicussed seasonal allergies vs virus or  both Symptomatic care at home, return precautions Work note provided. No questions  at this time   Final Clinical Impressions(s) / UC Diagnoses   Final diagnoses:  Seasonal allergies  Acute cough     Discharge Instructions      I recommend to start a once daily Zyrtec or Allegra to help with runny nose, congestion, drainage in the throat. You can continue the over-the-counter cough syrups. Make sure you are drinking lots of fluids You may have several days to a week of symptoms.  You can return if needed.     ED Prescriptions     Medication Sig Dispense Auth. Provider   cetirizine (ZYRTEC ALLERGY) 10 MG tablet Take 1 tablet (10 mg total) by mouth daily. 30 tablet Aneri Slagel, Lurena Joiner, PA-C      PDMP not reviewed this encounter.   Marlow Baars, [place] 11/27/22 1338

## 2022-11-27 NOTE — Discharge Instructions (Addendum)
I recommend to start a once daily Zyrtec or Allegra to help with runny nose, congestion, drainage in the throat. You can continue the over-the-counter cough syrups. Make sure you are drinking lots of fluids You may have several days to a week of symptoms.  You can return if needed.

## 2022-11-27 NOTE — ED Triage Notes (Signed)
C/O tactile fever, cough, sneezing onset yesterday. Has been taking OTC cough med.

## 2022-12-02 ENCOUNTER — Ambulatory Visit (HOSPITAL_COMMUNITY)
Admission: EM | Admit: 2022-12-02 | Discharge: 2022-12-02 | Disposition: A | Discharge status: Home / Self Care | Payer: PRIVATE HEALTH INSURANCE | Attending: Urgent Care | Admitting: Urgent Care

## 2022-12-02 ENCOUNTER — Encounter (HOSPITAL_COMMUNITY): Payer: Self-pay

## 2022-12-02 DIAGNOSIS — R0981 Nasal congestion: Secondary | ICD-10-CM

## 2022-12-02 DIAGNOSIS — I10 Essential (primary) hypertension: Secondary | ICD-10-CM | POA: Diagnosis not present

## 2022-12-02 DIAGNOSIS — F17210 Nicotine dependence, cigarettes, uncomplicated: Secondary | ICD-10-CM | POA: Insufficient documentation

## 2022-12-02 DIAGNOSIS — E119 Type 2 diabetes mellitus without complications: Secondary | ICD-10-CM | POA: Diagnosis not present

## 2022-12-02 DIAGNOSIS — Z1152 Encounter for screening for COVID-19: Secondary | ICD-10-CM | POA: Insufficient documentation

## 2022-12-02 DIAGNOSIS — J069 Acute upper respiratory infection, unspecified: Secondary | ICD-10-CM | POA: Diagnosis present

## 2022-12-02 DIAGNOSIS — B9789 Other viral agents as the cause of diseases classified elsewhere: Secondary | ICD-10-CM | POA: Diagnosis not present

## 2022-12-02 DIAGNOSIS — R059 Cough, unspecified: Secondary | ICD-10-CM | POA: Diagnosis not present

## 2022-12-02 DIAGNOSIS — F17200 Nicotine dependence, unspecified, uncomplicated: Secondary | ICD-10-CM

## 2022-12-02 DIAGNOSIS — Z794 Long term (current) use of insulin: Secondary | ICD-10-CM

## 2022-12-02 MED ORDER — IPRATROPIUM BROMIDE 0.03 % NA SOLN: 2.0000 | Freq: Two times a day (BID) | NASAL | 0 refills | Status: DC

## 2022-12-02 MED ORDER — PROMETHAZINE-DM 6.25-15 MG/5ML PO SYRP: 5.0000 mL | ORAL_SOLUTION | Freq: Three times a day (TID) | ORAL | 0 refills | Status: DC | PRN

## 2022-12-02 MED ORDER — PSEUDOEPHEDRINE HCL 60 MG PO TABS: 60.0000 mg | ORAL_TABLET | Freq: Three times a day (TID) | ORAL | 0 refills | Status: DC | PRN

## 2022-12-02 NOTE — ED Triage Notes (Signed)
Patient states that he was seen on 11/27/22 and since then has continued with a cough, nasal congestion and having chills. Patient is requesting a Covid test.  Patient states he has been taking OTC cold medication from [facility] and allergy medication that was prescribed 5 days ago.Patient states he has not taken any medication today.

## 2022-12-02 NOTE — Discharge Instructions (Addendum)
We will notify you of your test results as they arrive and may take between about 24 hours.  I encourage you to sign up for MyChart if you have not already done so as this can be the easiest way for [place] to communicate results to you online or through a phone app.  Generally, we only contact you if it is a positive test result.  In the meantime, if you develop worsening symptoms including fever, chest pain, shortness of breath despite our current treatment plan then please report to the emergency room as this may be a sign of worsening status from possible viral infection.  Otherwise, we will manage this as a viral syndrome. For sore throat or cough try using a honey-based tea. Use 3 teaspoons of honey with juice squeezed from half lemon. Place shaved pieces of ginger into 1/2-1 cup of water and warm over stove top. Then mix the ingredients and repeat every 4 hours as needed. Please take Tylenol 500mg -650mg  every 6 hours for aches and pains, fevers. Hydrate very well with at least 2 liters of water. Eat light meals such as soups to replenish electrolytes and soft fruits, veggies. Start an antihistamine like Zyrtec (10mg  daily) for postnasal drainage, sinus congestion.  You can take this together with pseudoephedrine (Sudafed) at a dose of 60 mg 2-3 times a day as needed for the same kind of congestion.  Use the nasal spray and cough medications as needed.

## 2022-12-02 NOTE — ED Provider Notes (Signed)
[facility]   MRN: [id number] DOB: Jan 13, 1966  Subjective:   Jeffrey Rodriguez is a 57 y.o. male presenting for 6-day history of acute onset persistent sinus congestion, sinus drainage, sinus pressure, postnasal drainage that is eliciting a cough.  No fever, chest pain, shortness of breath or wheezing, body pains, rashes.  No history of asthma.  Patient is a smoker, does 1/2ppd.  No history of COPD.  Patient is a type II diabetic treated with insulin, has hypertension.  Has been taking Zyrtec as prescribed at his last office visit with [place] on 11/27/2022.  Today, his primary goal is to make sure he does not have COVID.  No current facility-administered medications for this encounter.  Current Outpatient Medications:    aspirin EC (ECOTRIN LOW STRENGTH) 81 MG tablet, TAKE 1 TABLET  BY MOUTH DAILY . SWALLOW WHOLE, Disp: 90 tablet, Rfl: 3   atorvastatin (LIPITOR) 20 MG tablet, Take 1 tablet (20 mg total) by mouth daily., Disp: 90 tablet, Rfl: 3   blood glucose meter kit and supplies KIT, Inject 1 each into the skin as directed. Dispense based on patient and insurance preference. Use up to four times daily as directed, Disp: 100 each, Rfl: 3   cetirizine (ZYRTEC ALLERGY) 10 MG tablet, Take 1 tablet (10 mg total) by mouth daily., Disp: 30 tablet, Rfl: 2   Insulin Pen Needle (BD PEN NEEDLE NANO 2ND GEN) 32G X 4 MM MISC, USE 1  AT BEDTIME, Disp: 100 each, Rfl: 3   lisinopril-hydrochlorothiazide (ZESTORETIC) 20-12.5 MG tablet, Take 1 tablet by mouth daily., Disp: 90 tablet, Rfl: 3   metFORMIN (GLUCOPHAGE) 1000 MG tablet, Take 1 tablet (1,000 mg total) by mouth 2 (two) times daily with a meal., Disp: 180 tablet, Rfl: 1   tirzepatide (MOUNJARO) 7.5 MG/0.5ML Pen, Inject 7.5 mg into the skin once a week., Disp: 6 mL, Rfl: 1   No Known Allergies  Past Medical History:  Diagnosis Date   Diabetes mellitus without complication (HCC) 2010   GERD (gastroesophageal reflux disease)     Hyperlipidemia    Hypertension 2010   Microalbuminuria 11/2013   Mixed dyslipidemia    diet and exercise   Obesity    Smoker    0.5ppd   Wears glasses      Past Surgical History:  Procedure Laterality Date   COLONOSCOPY  12/2015   tubular adenoma, Dr. Amada Jupiter   WISDOM TOOTH EXTRACTION      Family History  Problem Relation Age of Onset   Arthritis Mother    Pneumonia Father    Depression Brother    Hypertension Other    Cancer Neg Hx    Stroke Neg Hx    Colon cancer Neg Hx    Colon polyps Neg Hx    Esophageal cancer Neg Hx    Rectal cancer Neg Hx    Stomach cancer Neg Hx    Heart disease Neg Hx    Diabetes Neg Hx     Social History   Tobacco Use   Smoking status: Every Day    Packs/day: 0.50    Years: 27.00    Additional pack years: 0.00    Total pack years: 13.50    Types: Cigarettes   Smokeless tobacco: Never  Vaping Use   Vaping Use: Never used  Substance Use Topics   Alcohol use: No    Alcohol/week: 0.0 standard drinks of alcohol   Drug use: No    ROS  Objective:   Vitals: BP 125/84 (BP Location: Left Arm)   Pulse 87   Temp 99.5 F (37.5 C) (Oral)   Resp 16   SpO2 96%   Physical Exam Constitutional:      General: He is not in acute distress.    Appearance: Normal appearance. He is well-developed and normal weight. He is not ill-appearing, toxic-appearing or diaphoretic.  HENT:     Head: Normocephalic and atraumatic.     Right Ear: Tympanic membrane, ear canal and external ear normal. No drainage, swelling or tenderness. No middle ear effusion. There is no impacted cerumen. Tympanic membrane is not erythematous or bulging.     Left Ear: Tympanic membrane, ear canal and external ear normal. No drainage, swelling or tenderness.  No middle ear effusion. There is no impacted cerumen. Tympanic membrane is not erythematous or bulging.     Nose: Congestion and rhinorrhea present.     Mouth/Throat:     Mouth: Mucous membranes are moist.      Pharynx: No oropharyngeal exudate or posterior oropharyngeal erythema.  Eyes:     General: No scleral icterus.       Right eye: No discharge.        Left eye: No discharge.     Extraocular Movements: Extraocular movements intact.     Conjunctiva/sclera: Conjunctivae normal.  Cardiovascular:     Rate and Rhythm: Normal rate and regular rhythm.     Heart sounds: Normal heart sounds. No murmur heard.    No friction rub. No gallop.  Pulmonary:     Effort: Pulmonary effort is normal. No respiratory distress.     Breath sounds: Normal breath sounds. No stridor. No wheezing, rhonchi or rales.  Musculoskeletal:     Cervical back: Normal range of motion and neck supple. No rigidity. No muscular tenderness.  Neurological:     General: No focal deficit present.     Mental Status: He is alert and oriented to person, place, and time.  Psychiatric:        Mood and Affect: Mood normal.        Behavior: Behavior normal.        Thought Content: Thought content normal.     Assessment and Plan :   PDMP not reviewed this encounter.  1. Viral URI with cough   2. Sinus congestion   3. Smoker   4. Essential hypertension   5. Type 2 diabetes mellitus treated with insulin [facility])    Deferred imaging given clear cardiopulmonary exam, hemodynamically stable vital signs. Will uphold antibiotic stewardship. Will manage for viral illness such as viral URI, viral syndrome, viral rhinitis, COVID-19. Recommended supportive care. Offered scripts for symptomatic relief. Testing is pending. Counseled patient on potential for adverse effects with medications prescribed/recommended today, ER and return-to-clinic precautions discussed, patient verbalized understanding.     Wallis Bamberg, PA-C 12/02/22 1425

## 2022-12-03 LAB — SARS CORONAVIRUS 2 (TAT 6-24 HRS): SARS Coronavirus 2: NEGATIVE

## 2022-12-04 ENCOUNTER — Encounter: Payer: Self-pay | Admitting: Gastroenterology

## 2022-12-12 ENCOUNTER — Encounter: Payer: Self-pay | Admitting: Gastroenterology

## 2022-12-28 ENCOUNTER — Ambulatory Visit (AMBULATORY_SURGERY_CENTER): Payer: PRIVATE HEALTH INSURANCE | Admitting: *Deleted

## 2022-12-28 VITALS — Ht 70.0 in | Wt 200.0 lb

## 2022-12-28 DIAGNOSIS — Z86010 Personal history of colonic polyps: Secondary | ICD-10-CM

## 2022-12-28 NOTE — Progress Notes (Signed)
Pt's name and DOB verified at the beginning of the pre-visit.  Pt denies any difficulty with ambulating,sitting, laying down or rolling side to side Gave both [facility] main # and MD on call # prior to instructions.  No egg or soy allergy known to patient  No issues known to pt with past sedation with any surgeries or procedures Patient denies ever being intubated Pt has no issues moving head neck or swallowing No FH of Malignant Hyperthermia Pt is not on diet pills Pt is not on home 02  Pt is not on blood thinners  Pt denies issues with constipation  Pt is not on dialysis Pt denise any abnormal heart rhythms  Pt denies any upcoming cardiac testing Pt encouraged to use to use Singlecare or Goodrx to reduce cost  Patient's chart reviewed by Cathlyn Parsons CNRA prior to pre-visit and patient appropriate for the [facility].  Pre-visit completed and red dot placed by patient's name on their procedure day (on provider's schedule).  . Visit by phone Pt states weight is 200 lb Instructed pt why it is important to and  to call if they have any changes in health or new medications. Directed them to the # given and on instructions.   Pt states they will.  Instructions reviewed with pt and pt states understanding. Instructed to review again prior to procedure. Pt states they will.  Instructions sent by mail with coupon and by my chart

## 2023-01-22 ENCOUNTER — Encounter: Payer: Self-pay | Admitting: Gastroenterology

## 2023-01-23 ENCOUNTER — Encounter: Payer: Self-pay | Admitting: Certified Registered Nurse Anesthetist

## 2023-01-25 ENCOUNTER — Ambulatory Visit (AMBULATORY_SURGERY_CENTER): Payer: PRIVATE HEALTH INSURANCE | Admitting: Gastroenterology

## 2023-01-25 ENCOUNTER — Encounter: Payer: Self-pay | Admitting: Gastroenterology

## 2023-01-25 VITALS — BP 107/74 | HR 62 | Temp 98.6°F | Resp 13 | Ht 70.0 in | Wt 200.0 lb

## 2023-01-25 DIAGNOSIS — Z86010 Personal history of colonic polyps: Secondary | ICD-10-CM

## 2023-01-25 DIAGNOSIS — D122 Benign neoplasm of ascending colon: Secondary | ICD-10-CM | POA: Diagnosis not present

## 2023-01-25 DIAGNOSIS — Z09 Encounter for follow-up examination after completed treatment for conditions other than malignant neoplasm: Secondary | ICD-10-CM

## 2023-01-25 MED ORDER — SODIUM CHLORIDE 0.9 % IV SOLN
500.0000 mL | Freq: Once | INTRAVENOUS | Status: DC
Start: 2023-01-25 — End: 2023-01-25

## 2023-01-25 NOTE — Progress Notes (Signed)
Called to room to assist during endoscopic procedure.  Patient ID and intended procedure confirmed with present staff. Received instructions for my participation in the procedure from the performing physician.  

## 2023-01-25 NOTE — Patient Instructions (Addendum)
Recommendation:           - Patient has a contact number available for                            emergencies. The signs and symptoms of potential                            delayed complications were discussed with the                            patient. Return to normal activities tomorrow.                            Written discharge instructions were provided to the                            patient.                           - Resume previous diet.                           - Continue present medications.                           - Await pathology results.                           - Repeat colonoscopy is recommended for                            surveillance. The colonoscopy date will be                            determined after pathology results from today's                            exam become available for review.  Handout on polyps given.  YOU HAD AN ENDOSCOPIC PROCEDURE TODAY AT THE [facility]:   Refer to the procedure report that was given to you for any specific questions about what was found during the examination.  If the procedure report does not answer your questions, please call your gastroenterologist to clarify.  If you requested that your care partner not be given the details of your procedure findings, then the procedure report has been included in a sealed envelope for you to review at your convenience later.  YOU SHOULD EXPECT: Some feelings of bloating in the abdomen. Passage of more gas than usual.  Walking can help get rid of the air that was put into your GI tract during the procedure and reduce the bloating. If you had a lower endoscopy (such as a colonoscopy or flexible sigmoidoscopy) you may notice spotting of blood in your stool or on the toilet paper. If you underwent a bowel prep for your procedure, you may not have a normal bowel movement for a few days.  Please Note:  You might notice some irritation and congestion in your nose or some  drainage.  This is from the oxygen used during your procedure.  There is no need for concern and it should clear up in a day or so.  SYMPTOMS TO REPORT IMMEDIATELY:  Following lower endoscopy (colonoscopy or flexible sigmoidoscopy):  Excessive amounts of blood in the stool  Significant tenderness or worsening of abdominal pains  Swelling of the abdomen that is new, acute  Fever of 100F or higher  For urgent or emergent issues, a gastroenterologist can be reached at any hour by calling [phone]. Do not use MyChart messaging for urgent concerns.    DIET:  We do recommend a small meal at first, but then you may proceed to your regular diet.  Drink plenty of fluids but you should avoid alcoholic beverages for 24 hours.  ACTIVITY:  You should plan to take it easy for the rest of today and you should NOT DRIVE or use heavy machinery until tomorrow (because of the sedation medicines used during the test).    FOLLOW UP: Our staff will call the number listed on your records the next business day following your procedure.  We will call around 7:15- 8:00 am to check on you and address any questions or concerns that you may have regarding the information given to you following your procedure. If we do not reach you, we will leave a message.     If any biopsies were taken you will be contacted by phone or by letter within the next 1-3 weeks.  Please call us at [phone] if you have not heard about the biopsies in 3 weeks.    SIGNATURES/CONFIDENTIALITY: You and/or your care partner have signed paperwork which will be entered into your electronic medical record.  These signatures attest to the fact that that the information above on your After Visit Summary has been reviewed and is understood.  Full responsibility of the confidentiality of this discharge information lies with you and/or your care-partner.

## 2023-01-25 NOTE — Op Note (Addendum)
[facility] Patient Name: Jeffrey Rodriguez Procedure Date: 01/25/2023 8:58 AM MRN: [id number] Endoscopist: Sherilyn Cooter L. Myrtie Neither , MD, [id number] Age: 57 Referring MD:  Date of Birth: [birth year] Gender: Male Account #: [id number] Procedure:                Colonoscopy Indications:              Surveillance: Personal history of adenomatous                            polyps on last colonoscopy > 5 years ago                           Diminutive tubular adenoma x2 June 2017 Medicines:                Monitored Anesthesia Care Procedure:                Pre-Anesthesia Assessment:                           - Prior to the procedure, a History and Physical                            was performed, and patient medications and                            allergies were reviewed. The patient's tolerance of                            previous anesthesia was also reviewed. The risks                            and benefits of the procedure and the sedation                            options and risks were discussed with the patient.                            All questions were answered, and informed consent                            was obtained. Prior Anticoagulants: The patient has                            taken no anticoagulant or antiplatelet agents. ASA                            Grade Assessment: II - A patient with mild systemic                            disease. After reviewing the risks and benefits,                            the patient was deemed in satisfactory condition to  undergo the procedure.                           After obtaining informed consent, the colonoscope                            was passed under direct vision. Throughout the                            procedure, the patient's blood pressure, pulse, and                            oxygen saturations were monitored continuously. The                            CF [id number] #[id number] was introduced  through the anus                            and advanced to the the terminal ileum, with                            identification of the appendiceal orifice and IC                            valve. The colonoscopy was performed without                            difficulty. The patient tolerated the procedure                            well. The quality of the bowel preparation was                            good. The ileocecal valve, appendiceal orifice, and                            rectum were photographed. The bowel preparation                            used was SUPREP via split dose instruction. Scope In: 9:13:08 AM Scope Out: 9:28:59 AM Scope Withdrawal Time: 0 hours 12 minutes 32 seconds  Total Procedure Duration: 0 hours 15 minutes 51 seconds  Findings:                 The perianal and digital rectal examinations were                            normal.                           The terminal ileum appeared normal.                           Repeat examination of right colon under NBI  performed.                           A diminutive polyp was found in the ascending                            colon. The polyp was sessile. The polyp was removed                            with a cold snare. Resection and retrieval were                            complete.                           Internal hemorrhoids were found. The hemorrhoids                            were small.                           The exam was otherwise without abnormality on                            direct and retroflexion views. Complications:            No immediate complications. Estimated Blood Loss:     Estimated blood loss was minimal. Impression:               - The examined portion of the ileum was normal.                           - One diminutive polyp in the ascending colon,                            removed with a cold snare. Resected and retrieved.                            - Internal hemorrhoids.                           - The examination was otherwise normal on direct                            and retroflexion views. Recommendation:           - Patient has a contact number available for                            emergencies. The signs and symptoms of potential                            delayed complications were discussed with the                            patient. Return to normal activities tomorrow.  Written discharge instructions were provided to the                            patient.                           - Resume previous diet.                           - Continue present medications.                           - Await pathology results.                           - Repeat colonoscopy is recommended for                            surveillance. The colonoscopy date will be                            determined after pathology results from today's                            exam become available for review. Zalaya Astarita L. Myrtie Neither, MD 01/25/2023 9:32:37 AM This report has been signed electronically.

## 2023-01-25 NOTE — Progress Notes (Signed)
Pt's states no medical or surgical changes since previsit or office visit. 

## 2023-01-25 NOTE — Progress Notes (Signed)
History and Physical:  This patient presents for endoscopic testing for: Encounter Diagnosis  Name Primary?   Personal history of colonic polyps Yes    Surveillance exam - June 2017, dim TA x 2  Patient is otherwise without complaints or active issues today.   Past Medical History: Past Medical History:  Diagnosis Date   Diabetes mellitus without complication (HCC) 2010   GERD (gastroesophageal reflux disease)    Hyperlipidemia    Hypertension 2010   Microalbuminuria 11/2013   Mixed dyslipidemia    diet and exercise   Obesity    Smoker    0.5ppd   Wears glasses      Past Surgical History: Past Surgical History:  Procedure Laterality Date   COLONOSCOPY  12/2015   tubular adenoma, Dr. Amada Jupiter   WISDOM TOOTH EXTRACTION      Allergies: No Known Allergies  Outpatient Meds: Current Outpatient Medications  Medication Sig Dispense Refill   aspirin EC (ECOTRIN LOW STRENGTH) 81 MG tablet TAKE 1 TABLET  BY MOUTH DAILY . SWALLOW WHOLE 90 tablet 3   atorvastatin (LIPITOR) 20 MG tablet Take 1 tablet (20 mg total) by mouth daily. 90 tablet 3   lisinopril-hydrochlorothiazide (ZESTORETIC) 20-12.5 MG tablet Take 1 tablet by mouth daily. 90 tablet 3   metFORMIN (GLUCOPHAGE) 1000 MG tablet Take 1 tablet (1,000 mg total) by mouth 2 (two) times daily with a meal. 180 tablet 1   blood glucose meter kit and supplies KIT Inject 1 each into the skin as directed. Dispense based on patient and insurance preference. Use up to four times daily as directed 100 each 3   cetirizine (ZYRTEC ALLERGY) 10 MG tablet Take 1 tablet (10 mg total) by mouth daily. 30 tablet 2   Insulin Pen Needle (BD PEN NEEDLE NANO 2ND GEN) 32G X 4 MM MISC USE 1  AT BEDTIME 100 each 3   ipratropium (ATROVENT) 0.03 % nasal spray Place 2 sprays into both nostrils 2 (two) times daily. 30 mL 0   promethazine-dextromethorphan (PROMETHAZINE-DM) 6.25-15 MG/5ML syrup Take 5 mLs by mouth 3 (three) times daily as needed for  cough. (Patient not taking: Reported on 12/28/2022) 200 mL 0   pseudoephedrine (SUDAFED) 60 MG tablet Take 1 tablet (60 mg total) by mouth every 8 (eight) hours as needed for congestion. (Patient not taking: Reported on 12/28/2022) 30 tablet 0   tirzepatide (MOUNJARO) 7.5 MG/0.5ML Pen Inject 7.5 mg into the skin once a week. 6 mL 1   Current Facility-Administered Medications  Medication Dose Route Frequency Provider Last Rate Last Admin   0.9 %  sodium chloride infusion  500 mL Intravenous Once Sherrilyn Rist, MD          ___________________________________________________________________ Objective   Exam:  BP 119/87   Pulse 63   Temp 98.6 F (37 C)   Resp 20   Ht 5\' 10"  (1.778 m)   Wt 200 lb (90.7 kg)   SpO2 99%   BMI 28.70 kg/m   CV: regular , S1/S2 Resp: clear to auscultation bilaterally, normal RR and effort noted GI: soft, no tenderness, with active bowel sounds.   Assessment: Encounter Diagnosis  Name Primary?   Personal history of colonic polyps Yes     Plan: Colonoscopy   The benefits and risks of the planned procedure were described in detail with the patient or (when appropriate) their health care proxy.  Risks were outlined as including, but not limited to, bleeding, infection, perforation, adverse medication reaction leading  to cardiac or pulmonary decompensation, pancreatitis (if ERCP).  The limitation of incomplete mucosal visualization was also discussed.  No guarantees or warranties were given.  The patient is appropriate for an endoscopic procedure in the ambulatory setting.   - Amada Jupiter, MD

## 2023-01-25 NOTE — Progress Notes (Signed)
Report given to PACU, vss 

## 2023-01-28 ENCOUNTER — Telehealth: Payer: Self-pay | Admitting: *Deleted

## 2023-01-28 NOTE — Telephone Encounter (Signed)
 Post procedure follow up phone call. No answer at number given.  Left message on voicemail.  

## 2023-01-29 ENCOUNTER — Encounter: Payer: Self-pay | Admitting: Gastroenterology

## 2023-03-07 LAB — HM DIABETES EYE EXAM

## 2023-03-08 ENCOUNTER — Encounter: Payer: Self-pay | Admitting: Internal Medicine

## 2023-07-11 ENCOUNTER — Telehealth: Payer: Self-pay | Admitting: Medical

## 2023-07-11 MED ORDER — BD PEN NEEDLE NANO 2ND GEN 32G X 4 MM MISC: 3 refills | Status: AC

## 2023-07-11 NOTE — Telephone Encounter (Signed)
done

## 2023-07-11 NOTE — Telephone Encounter (Signed)
Pt is needing Insulin Pen Needle sent in for this month. He uses  [id number] - [place] - [address]

## 2023-08-15 ENCOUNTER — Encounter: Payer: PRIVATE HEALTH INSURANCE | Admitting: Medical

## 2023-08-23 ENCOUNTER — Encounter: Payer: Self-pay | Admitting: Medical

## 2023-08-23 ENCOUNTER — Ambulatory Visit: Payer: PRIVATE HEALTH INSURANCE | Admitting: Medical

## 2023-08-23 VITALS — BP 120/70 | HR 74 | Ht 70.0 in | Wt 209.0 lb

## 2023-08-23 DIAGNOSIS — I8393 Asymptomatic varicose veins of bilateral lower extremities: Secondary | ICD-10-CM

## 2023-08-23 DIAGNOSIS — E782 Mixed hyperlipidemia: Secondary | ICD-10-CM

## 2023-08-23 DIAGNOSIS — E118 Type 2 diabetes mellitus with unspecified complications: Secondary | ICD-10-CM

## 2023-08-23 DIAGNOSIS — R809 Proteinuria, unspecified: Secondary | ICD-10-CM

## 2023-08-23 DIAGNOSIS — I700 Atherosclerosis of aorta: Secondary | ICD-10-CM

## 2023-08-23 DIAGNOSIS — F17200 Nicotine dependence, unspecified, uncomplicated: Secondary | ICD-10-CM

## 2023-08-23 DIAGNOSIS — I10 Essential (primary) hypertension: Secondary | ICD-10-CM | POA: Diagnosis not present

## 2023-08-23 DIAGNOSIS — Z122 Encounter for screening for malignant neoplasm of respiratory organs: Secondary | ICD-10-CM

## 2023-08-23 DIAGNOSIS — Z0000 Encounter for general adult medical examination without abnormal findings: Secondary | ICD-10-CM | POA: Diagnosis not present

## 2023-08-23 DIAGNOSIS — Z125 Encounter for screening for malignant neoplasm of prostate: Secondary | ICD-10-CM

## 2023-08-23 MED ORDER — FREESTYLE LIBRE 3 PLUS SENSOR MISC
2 refills | Status: DC
Start: 2023-08-23 — End: 2023-08-26

## 2023-08-23 NOTE — Addendum Note (Signed)
 Addended by: Charliene Conte A on: 08/23/2023 11:29 AM   Modules accepted: Orders

## 2023-08-23 NOTE — Progress Notes (Signed)
 Subjective:   HPI  Jeffrey Rodriguez is a 58 y.o. male who presents for Chief Complaint  Patient presents with   Annual Exam    Fasting cpe    Patient Care Team: Bryston Colocho, Alm RAMAN, PA-C as PCP - General (Family Medicine) Legrand, Victory LITTIE MOULD, MD as Consulting Physician (Gastroenterology) Sees dentist Sees eye doctor, Dr. Alvia, Dr. Reena Bristle (retinal specialist) Dr. Victory Legrand, GI Dr. Wilbert Bihari, cardiology Dr. Juliene Medicine, podiatry   Concerns: Here for yearly well visit  Compliant with all medications.  Still smoking, <1/2 ppd.   Hx/o 20+ years smoking.   Reviewed their medical, surgical, family, social, medication, and allergy history and updated chart as appropriate.  Past Medical History:  Diagnosis Date   Diabetes mellitus without complication (HCC) 2010   GERD (gastroesophageal reflux disease)    Hyperlipidemia    Hypertension 2010   Microalbuminuria 11/2013   Mixed dyslipidemia    diet and exercise   Obesity    Smoker    0.5ppd   Wears glasses     Past Surgical History:  Procedure Laterality Date   COLONOSCOPY  12/2015   tubular adenoma, Dr. Victory Legrand   WISDOM TOOTH EXTRACTION      Family History  Problem Relation Age of Onset   Arthritis Mother    Pneumonia Father    Depression Brother    Hypertension Other    Cancer Neg Hx    Stroke Neg Hx    Colon cancer Neg Hx    Colon polyps Neg Hx    Esophageal cancer Neg Hx    Rectal cancer Neg Hx    Stomach cancer Neg Hx    Heart disease Neg Hx    Diabetes Neg Hx      Current Outpatient Medications:    aspirin  EC (ECOTRIN LOW STRENGTH) 81 MG tablet, TAKE 1 TABLET  BY MOUTH DAILY . SWALLOW WHOLE, Disp: 90 tablet, Rfl: 3   atorvastatin  (LIPITOR) 20 MG tablet, Take 1 tablet (20 mg total) by mouth daily., Disp: 90 tablet, Rfl: 3   lisinopril -hydrochlorothiazide  (ZESTORETIC ) 20-12.5 MG tablet, Take 1 tablet by mouth daily., Disp: 90 tablet, Rfl: 3   metFORMIN  (GLUCOPHAGE ) 1000 MG tablet, Take  1 tablet (1,000 mg total) by mouth 2 (two) times daily with a meal., Disp: 180 tablet, Rfl: 1   SOLIQUA  100-33 UNT-MCG/ML SOPN, Inject 25 Units into the skin daily., Disp: , Rfl:    blood glucose meter kit and supplies KIT, Inject 1 each into the skin as directed. Dispense based on patient and insurance preference. Use up to four times daily as directed, Disp: 100 each, Rfl: 3   Insulin  Pen Needle (BD PEN NEEDLE NANO 2ND GEN) 32G X 4 MM MISC, USE 1  AT BEDTIME, Disp: 100 each, Rfl: 3   pseudoephedrine  (SUDAFED) 60 MG tablet, Take 1 tablet (60 mg total) by mouth every 8 (eight) hours as needed for congestion. (Patient not taking: Reported on 08/23/2023), Disp: 30 tablet, Rfl: 0  No Known Allergies   Review of Systems  Constitutional:  Negative for chills, fever, malaise/fatigue and weight loss.  HENT:  Negative for congestion, ear pain, hearing loss, sore throat and tinnitus.   Eyes:  Negative for blurred vision, pain and redness.  Respiratory:  Negative for cough, hemoptysis and shortness of breath.   Cardiovascular:  Negative for chest pain, palpitations, orthopnea, claudication and leg swelling.  Gastrointestinal:  Negative for abdominal pain, blood in stool, constipation, diarrhea, nausea and vomiting.  Genitourinary:  Negative for dysuria, flank pain, frequency, hematuria and urgency.  Musculoskeletal:  Negative for falls, joint pain and myalgias.  Skin:  Negative for itching and rash.  Neurological:  Negative for dizziness, tingling, speech change, weakness and headaches.  Endo/Heme/Allergies:  Negative for polydipsia. Does not bruise/bleed easily.  Psychiatric/Behavioral:  Negative for depression and memory loss. The patient is not nervous/anxious and does not have insomnia.         Objective:  BP 120/70   Pulse 74   Ht 5' 10 (1.778 m)   Wt 209 lb (94.8 kg)   SpO2 97%   BMI 29.99 kg/m   Wt Readings from Last 3 Encounters:  08/23/23 209 lb (94.8 kg)  01/25/23 200 lb (90.7 kg)   12/28/22 200 lb (90.7 kg)   BP Readings from Last 3 Encounters:  08/23/23 120/70  01/25/23 107/74  12/02/22 125/84   General appearance: alert, no distress, WD/WN, African American male Skin: unremarkable HEENT: normocephalic, conjunctiva/corneas normal, sclerae anicteric, PERRLA, EOMi, nares patent, no discharge or erythema, pharynx normal Oral cavity: MMM, tongue normal, teeth missing a few upper molars bilat Neck: supple, no lymphadenopathy, no thyromegaly, no masses, normal ROM, no bruits Chest: non tender, normal shape and expansion Heart: RRR, normal S1, S2, no murmurs Lungs: CTA bilaterally, no wheezes, rhonchi, or rales Abdomen: +bs, soft, non tender, non distended, no masses, no hepatomegaly, no splenomegaly, no bruits Back: non tender, normal ROM, no scoliosis Musculoskeletal: upper extremities non tender, no obvious deformity, normal ROM throughout, lower extremities non tender, no obvious deformity, normal ROM throughout Extremities: mild varicose veins, bilat, no edema, no cyanosis, no clubbing Pulses: 2+ symmetric, upper and lower extremities, normal cap refill Neurological: alert, oriented x 3, CN2-12 intact, strength normal upper extremities and lower extremities, sensation normal throughout, DTRs 2+ throughout, no cerebellar signs, gait normal Psychiatric: normal affect, behavior normal, pleasant  GU: declines Rectal: declines  Diabetic Foot Exam - Simple   Simple Foot Form Diabetic Foot exam was performed with the following findings: Yes 08/23/2023 11:12 AM  Visual Inspection See comments: Yes Sensation Testing Intact to touch and monofilament testing bilaterally: Yes Pulse Check Posterior Tibialis and Dorsalis pulse intact bilaterally: Yes Comments Thickened left and right great toenails and some discoloration, otherwise feet unremarkable      Assessment and Plan :   Encounter Diagnoses  Name Primary?   Routine general medical examination at a health  care facility Yes   Diabetes mellitus with complication [facility])    Essential hypertension    Microalbuminuria    Mixed dyslipidemia    Screening for prostate cancer    Varicose veins of both lower extremities, unspecified whether complicated    Aortic atherosclerosis (HCC)    Screening for lung cancer    Smoker      Today you had a preventative care visit or wellness visit.    Topics today may have included healthy lifestyle, diet, exercise, preventative care, vaccinations, sick and well care, proper use of emergency dept and after hours care, as well as other concerns.     Recommendations: Continue to return yearly for your annual wellness and preventative care visits.  This gives us  a chance to discuss healthy lifestyle, exercise, vaccinations, review your chart record, and perform screenings where appropriate.  I recommend you see your eye doctor yearly for routine vision care and yearly diabetic eye exam  I recommend you see your dentist yearly for routine dental care including hygiene visits twice yearly.  Vaccination recommendations were reviewed Immunization History  Administered Date(s) Administered   Influenza,inj,Quad PF,6+ Mos 03/11/2014, 04/12/2016, 03/27/2018, 04/29/2019, 04/07/2020, 04/06/2021, 04/05/2022   Influenza-Unspecified 03/17/2015, 03/27/2017   PFIZER(Purple Top)SARS-COV-2 Vaccination 11/14/2019, 12/05/2019, 06/27/2020   PNEUMOCOCCAL CONJUGATE-20 06/23/2021   Pneumococcal Polysaccharide-23 11/19/2013   Tdap 01/12/2016   Zoster Recombinant(Shingrix) 10/26/2020, 12/29/2020     Screening for cancer: Colon cancer screening:  I reviewed your colonoscopy on file from 2024.  Repeat in 7 years.  We discussed PSA, prostate exam, and prostate cancer screening risks/benefits.     Skin cancer screening: Check your skin regularly for new changes, growing lesions, or other lesions of concern Come in for evaluation if you have skin lesions of concern.  Lung  cancer screening: If you have a greater than 30 pack year history of tobacco use, then you qualify for lung cancer screening with a chest CT scan  We currently don't have screenings for other cancers besides breast, cervical, colon, and lung cancers.  If you have a strong family history of cancer or have other cancer screening concerns, please let me know.    Bone health: Get at least 150 minutes of aerobic exercise weekly Get weight bearing exercise at least once weekly   Heart health/vascular health: Get at least 150 minutes of aerobic exercise weekly Limit alcohol It is important to maintain a healthy blood pressure and healthy cholesterol numbers Reviewed 2019 exercise stress test which was reassuring  EKG reviewed today  Consider updated ABI blood flow screens of legs and aortic aneurysm screen of abdomen.    Consider CT coronary heart disease test.  If agreeable to any of these, let me know and we will refer.    Separate significant issues discussed: Hypertension-continue current medication atorvastatin  20 mg daily and aspirin  81 mg daily  Hyperlipidemia-continue current medication, Lisinopril  HCT 20/12.5mg  daily  Diabetes-continue current medication.   Continue Soliqua  25 units daily and Metformin  1000mg  BID  I recommend you quit smoking.  The longer he smoked a higher risk you have COPD lung changes and lung cancer  Hardin was seen today for annual exam.  Diagnoses and all orders for this visit:  Routine general medical examination at a health care facility -     Microalbumin/Creatinine Ratio, Urine -     Comprehensive metabolic panel -     CBC -     PSA -     TSH -     Lipid panel -     Hemoglobin A1c  Diabetes mellitus with complication (HCC) -     Microalbumin/Creatinine Ratio, Urine -     Hemoglobin A1c  Essential hypertension  Microalbuminuria -     Microalbumin/Creatinine Ratio, Urine  Mixed dyslipidemia -     Lipid panel  Screening for  prostate cancer -     PSA  Varicose veins of both lower extremities, unspecified whether complicated  Aortic atherosclerosis (HCC) -     Lipid panel  Screening for lung cancer -     CT CHEST LUNG CA SCREEN LOW DOSE W/O CM; Future  Smoker -     CT CHEST LUNG CA SCREEN LOW DOSE W/O CM; Future   Follow-up pending labs, yearly for physical

## 2023-08-24 LAB — COMPREHENSIVE METABOLIC PANEL
ALT: 22 [IU]/L (ref 0–44)
AST: 26 [IU]/L (ref 0–40)
Albumin: 4.1 g/dL (ref 3.8–4.9)
Alkaline Phosphatase: 90 [IU]/L (ref 44–121)
BUN/Creatinine Ratio: 13 (ref 9–20)
BUN: 12 mg/dL (ref 6–24)
Bilirubin Total: 0.5 mg/dL (ref 0.0–1.2)
CO2: 21 mmol/L (ref 20–29)
Calcium: 9.5 mg/dL (ref 8.7–10.2)
Chloride: 103 mmol/L (ref 96–106)
Creatinine, Ser: 0.94 mg/dL (ref 0.76–1.27)
Globulin, Total: 2.9 g/dL (ref 1.5–4.5)
Glucose: 96 mg/dL (ref 70–99)
Potassium: 3.6 mmol/L (ref 3.5–5.2)
Sodium: 140 mmol/L (ref 134–144)
Total Protein: 7 g/dL (ref 6.0–8.5)
eGFR: 93 mL/min/{1.73_m2} (ref >59)

## 2023-08-24 LAB — LIPID PANEL
Chol/HDL Ratio: 2.2 {ratio} (ref 0.0–5.0)
Cholesterol, Total: 84 mg/dL — ABNORMAL LOW (ref 100–199)
HDL: 38 mg/dL — ABNORMAL LOW (ref >39)
LDL Chol Calc (NIH): 31 mg/dL (ref 0–99)
Triglycerides: 69 mg/dL (ref 0–149)
VLDL Cholesterol Cal: 15 mg/dL (ref 5–40)

## 2023-08-24 LAB — CBC
Hematocrit: 41.9 % (ref 37.5–51.0)
Hemoglobin: 14.5 g/dL (ref 13.0–17.7)
MCH: 30 pg (ref 26.6–33.0)
MCHC: 34.6 g/dL (ref 31.5–35.7)
MCV: 87 fL (ref 79–97)
Platelets: 233 10*3/uL (ref 150–450)
RBC: 4.84 x10E6/uL (ref 4.14–5.80)
RDW: 13.8 % (ref 11.6–15.4)
WBC: 4.5 10*3/uL (ref 3.4–10.8)

## 2023-08-24 LAB — MICROALBUMIN / CREATININE URINE RATIO
Creatinine, Urine: 161.7 mg/dL
Microalb/Creat Ratio: 8 mg/g{creat} (ref 0–29)
Microalbumin, Urine: 12.7 ug/mL

## 2023-08-24 LAB — PSA: Prostate Specific Ag, Serum: 1.7 ng/mL (ref 0.0–4.0)

## 2023-08-24 LAB — HEMOGLOBIN A1C
Est. average glucose Bld gHb Est-mCnc: 192 mg/dL
Hgb A1c MFr Bld: 8.3 % — ABNORMAL HIGH (ref 4.8–5.6)

## 2023-08-24 LAB — TSH: TSH: 1.62 u[IU]/mL (ref 0.450–4.500)

## 2023-08-26 ENCOUNTER — Other Ambulatory Visit: Payer: Self-pay | Admitting: Medical

## 2023-08-26 ENCOUNTER — Other Ambulatory Visit: Payer: Self-pay | Admitting: Internal Medicine

## 2023-08-26 ENCOUNTER — Encounter: Payer: Self-pay | Admitting: Internal Medicine

## 2023-08-26 MED ORDER — BLOOD GLUCOSE TEST VI STRP: ORAL_STRIP | 0 refills | Status: AC

## 2023-08-26 MED ORDER — BLOOD GLUCOSE MONITORING SUPPL DEVI: 0 refills | Status: AC

## 2023-08-26 MED ORDER — LISINOPRIL-HYDROCHLOROTHIAZIDE 20-12.5 MG PO TABS: 1.0000 | ORAL_TABLET | Freq: Every day | ORAL | 3 refills | Status: AC

## 2023-08-26 MED ORDER — METFORMIN HCL 1000 MG PO TABS: 1000.0000 mg | ORAL_TABLET | Freq: Two times a day (BID) | ORAL | 1 refills | Status: AC

## 2023-08-26 MED ORDER — ATORVASTATIN CALCIUM 10 MG PO TABS: 10.0000 mg | ORAL_TABLET | Freq: Every day | ORAL | 2 refills | Status: DC

## 2023-08-26 MED ORDER — ASPIRIN 81 MG PO TBEC: DELAYED_RELEASE_TABLET | ORAL | 3 refills | Status: AC

## 2023-08-26 MED ORDER — LANCETS MISC. MISC: 0 refills | Status: AC

## 2023-08-26 MED ORDER — LANCET DEVICE MISC: 0 refills | Status: AC

## 2023-08-26 MED ORDER — SOLIQUA 100-33 UNT-MCG/ML ~~LOC~~ SOPN: 45.0000 [IU] | PEN_INJECTOR | Freq: Every day | SUBCUTANEOUS | 3 refills | Status: DC

## 2023-08-26 NOTE — Progress Notes (Signed)
 Results sent through MyChart

## 2023-09-26 ENCOUNTER — Other Ambulatory Visit: Payer: PRIVATE HEALTH INSURANCE

## 2023-12-16 ENCOUNTER — Encounter (HOSPITAL_COMMUNITY): Payer: Self-pay

## 2023-12-16 ENCOUNTER — Ambulatory Visit (HOSPITAL_COMMUNITY)
Admission: EM | Admit: 2023-12-16 | Discharge: 2023-12-16 | Disposition: A | Discharge status: Home / Self Care | Payer: PRIVATE HEALTH INSURANCE

## 2023-12-16 DIAGNOSIS — J302 Other seasonal allergic rhinitis: Secondary | ICD-10-CM | POA: Diagnosis not present

## 2023-12-16 MED ORDER — AZELASTINE HCL 0.1 % NA SOLN
1.0000 | Freq: Two times a day (BID) | NASAL | 1 refills | Status: DC
Start: 2023-12-16 — End: 2024-01-23

## 2023-12-16 MED ORDER — FEXOFENADINE HCL 180 MG PO TABS
180.0000 mg | ORAL_TABLET | Freq: Every day | ORAL | 1 refills | Status: DC
Start: 2023-12-16 — End: 2024-01-23

## 2023-12-16 NOTE — ED Provider Notes (Signed)
 [facility]  Note:  This document was prepared using Dragon voice recognition software and may include unintentional dictation errors.  MRN: [id number] DOB: Jan 15, 1966  Subjective:   Jeffrey Rodriguez is a 58 y.o. male presenting for nasal congestion, runny nose, headache x 1 year.  Patient reports taking Mucinex, Claritin, Sudafed, Zantac with no relief of symptoms.  Patient believes that symptoms may be getting worse.  Patient denies using any over-the-counter nasal sprays to help with symptoms.  No cough, sore throat, body aches, fatigue, fever.  No current facility-administered medications for this encounter.  Current Outpatient Medications:    azelastine (ASTELIN) 0.1 % nasal spray, Place 1 spray into both nostrils 2 (two) times daily. Use in each nostril as directed, Disp: 30 mL, Rfl: 1   Blood Glucose Monitoring Suppl DEVI, Test 1-2 times day. Pend on Insurance, Disp: 1 each, Rfl: 0   fexofenadine (ALLEGRA) 180 MG tablet, Take 1 tablet (180 mg total) by mouth daily., Disp: 60 tablet, Rfl: 1   Glucose Blood (BLOOD GLUCOSE TEST STRIPS) STRP, Test 1-2 times daily, Disp: 100 strip, Rfl: 0   Lancet Device MISC, 1-2 times daily, Disp: 1 each, Rfl: 0   Lancets Misc. MISC, 1-2 times a day, Disp: 100 each, Rfl: 0   aspirin  EC (ECOTRIN LOW STRENGTH) 81 MG tablet, TAKE 1 TABLET  BY MOUTH DAILY . SWALLOW WHOLE, Disp: 90 tablet, Rfl: 3   atorvastatin  (LIPITOR) 10 MG tablet, Take 1 tablet (10 mg total) by mouth daily., Disp: 90 tablet, Rfl: 2   blood glucose meter kit and supplies KIT, Inject 1 each into the skin as directed. Dispense based on patient and insurance preference. Use up to four times daily as directed, Disp: 100 each, Rfl: 3   Insulin  Pen Needle (BD PEN NEEDLE NANO 2ND GEN) 32G X 4 MM MISC, USE 1  AT BEDTIME, Disp: 100 each, Rfl: 3   lisinopril -hydrochlorothiazide  (ZESTORETIC ) 20-12.5 MG tablet, Take 1 tablet by mouth daily., Disp: 90 tablet, Rfl: 3   metFORMIN  (GLUCOPHAGE )  1000 MG tablet, Take 1 tablet (1,000 mg total) by mouth 2 (two) times daily with a meal., Disp: 180 tablet, Rfl: 1   SOLIQUA  100-33 UNT-MCG/ML SOPN, Inject 45 Units into the skin daily., Disp: 15 mL, Rfl: 3   No Known Allergies  Past Medical History:  Diagnosis Date   Diabetes mellitus without complication (HCC) 2010   GERD (gastroesophageal reflux disease)    Hyperlipidemia    Hypertension 2010   Microalbuminuria 11/2013   Mixed dyslipidemia    diet and exercise   Obesity    Smoker    0.5ppd   Wears glasses      Past Surgical History:  Procedure Laterality Date   COLONOSCOPY  12/2015   tubular adenoma, Dr. Lorella Roles   WISDOM TOOTH EXTRACTION      Family History  Problem Relation Age of Onset   Arthritis Mother    Pneumonia Father    Depression Brother    Hypertension Other    Cancer Neg Hx    Stroke Neg Hx    Colon cancer Neg Hx    Colon polyps Neg Hx    Esophageal cancer Neg Hx    Rectal cancer Neg Hx    Stomach cancer Neg Hx    Heart disease Neg Hx    Diabetes Neg Hx     Social History   Tobacco Use   Smoking status: Every Day    Current packs/day: 0.50  Average packs/day: 0.5 packs/day for 27.0 years (13.5 ttl pk-yrs)    Types: Cigarettes   Smokeless tobacco: Never  Vaping Use   Vaping status: Never Used  Substance Use Topics   Alcohol use: No    Alcohol/week: 0.0 standard drinks of alcohol   Drug use: No    ROS Refer to HPI for ROS details.  Objective:   Vitals: BP 120/81 (BP Location: Left Arm)   Pulse 88   Temp 98.6 F (37 C) (Oral)   Resp 16   SpO2 96%   Physical Exam Vitals and nursing note reviewed.  Constitutional:      General: He is not in acute distress.    Appearance: Normal appearance. He is well-developed. He is not ill-appearing or toxic-appearing.  HENT:     Head: Normocephalic and atraumatic.     Nose: Congestion and rhinorrhea present.     Mouth/Throat:     Mouth: Mucous membranes are moist.     Pharynx:  Oropharynx is clear. Posterior oropharyngeal erythema present. No oropharyngeal exudate.  Cardiovascular:     Rate and Rhythm: Normal rate and regular rhythm.     Heart sounds: Normal heart sounds. No murmur heard. Pulmonary:     Effort: Pulmonary effort is normal. No respiratory distress.     Breath sounds: Normal breath sounds. No stridor. No wheezing, rhonchi or rales.  Skin:    General: Skin is warm and dry.  Neurological:     General: No focal deficit present.     Mental Status: He is alert and oriented to person, place, and time.  Psychiatric:        Mood and Affect: Mood normal.        Behavior: Behavior normal.     Procedures  No results found for this or any previous visit (from the past 24 hours).  No results found.   Assessment and Plan :     Discharge Instructions       1. Seasonal allergies (Primary) - fexofenadine (ALLEGRA) 180 MG tablet; Take 1 tablet (180 mg total) by mouth daily.  Dispense: 60 tablet; Refill: 1 - azelastine (ASTELIN) 0.1 % nasal spray; Place 1 spray into both nostrils 2 (two) times daily. Use in each nostril as directed  Dispense: 30 mL; Refill: 1 - Ambulatory referral to ENT for follow-up evaluation if symptoms not improved with current medication regimen. -Continue to monitor symptoms for any change in severity if there is any escalation of current symptoms or development of new symptoms follow-up in ER for further evaluation and management.    Martasia Talamante B Philisha Weinel   Damontre Millea, [place], [place] 12/16/23 1348

## 2023-12-16 NOTE — Discharge Instructions (Signed)
  1. Seasonal allergies (Primary) - fexofenadine (ALLEGRA) 180 MG tablet; Take 1 tablet (180 mg total) by mouth daily.  Dispense: 60 tablet; Refill: 1 - azelastine (ASTELIN) 0.1 % nasal spray; Place 1 spray into both nostrils 2 (two) times daily. Use in each nostril as directed  Dispense: 30 mL; Refill: 1 - Ambulatory referral to ENT for follow-up evaluation if symptoms not improved with current medication regimen. -Continue to monitor symptoms for any change in severity if there is any escalation of current symptoms or development of new symptoms follow-up in ER for further evaluation and management.

## 2023-12-16 NOTE — ED Triage Notes (Signed)
 Patient here today with c/o nasal congestion and headache that has been ongoing for about a year. Patient states that is seems to be worsening. Patient has tried taking Mucinex, Claritin, Sudafed, and Zantac with no relief.

## 2024-01-23 ENCOUNTER — Encounter (HOSPITAL_COMMUNITY): Payer: Self-pay

## 2024-01-23 ENCOUNTER — Ambulatory Visit (HOSPITAL_COMMUNITY)
Admission: EM | Admit: 2024-01-23 | Discharge: 2024-01-23 | Disposition: A | Discharge status: Home / Self Care | Payer: PRIVATE HEALTH INSURANCE | Attending: Nurse Practitioner | Admitting: Nurse Practitioner

## 2024-01-23 DIAGNOSIS — K029 Dental caries, unspecified: Secondary | ICD-10-CM

## 2024-01-23 DIAGNOSIS — K0889 Other specified disorders of teeth and supporting structures: Secondary | ICD-10-CM | POA: Diagnosis not present

## 2024-01-23 MED ORDER — CHLORHEXIDINE GLUCONATE 0.12 % MT SOLN: 15.0000 mL | OROMUCOSAL | 0 refills | Status: AC

## 2024-01-23 MED ORDER — AMOXICILLIN 875 MG PO TABS: 875.0000 mg | ORAL_TABLET | ORAL | 0 refills | Status: AC

## 2024-01-23 MED ORDER — IBUPROFEN 800 MG PO TABS: 800.0000 mg | ORAL_TABLET | Freq: Three times a day (TID) | ORAL | 0 refills | Status: AC | PRN

## 2024-01-23 NOTE — Discharge Instructions (Addendum)
 You were seen today for dental pain. Take the antibiotics exactly as prescribed to help prevent or treat any infection. Use the prescribed mouth rinse twice a day after brushing your teeth to help keep the area clean. After eating, rinse your mouth with water to remove any food particles and reduce irritation.  Take the naproxen  twice a day with food to help with pain and inflammation. Do not take any aspirin  or other over-the-counter NSAIDs while using this medication. Tylenol  can also be used along with the other medications. Take two 500 mg tablets (a total of 1000 mg) every 6 hours as needed. Do not take more than 4000 mg of Tylenol  in 24 hours.  Avoid any foods or drinks that make the pain worse. Stick to soft foods while your mouth is healing, and do not use straws, as this can delay healing.

## 2024-01-23 NOTE — ED Provider Notes (Signed)
 [facility]    CSN: [id number] Arrival date & time: 01/23/24  1253      History   Chief Complaint Chief Complaint  Patient presents with   Dental Pain    HPI Jeffrey Rodriguez is a 58 y.o. male.   Discussed the use of AI scribe software for clinical note transcription with the patient, who gave verbal consent to proceed.   Patient presents with tooth pain that has been worsening. The patient reports face pain without swelling, associated with a tooth that has already been identified as needing extraction. He denies ear pain, headache, or dizziness. The patient has an appointment scheduled with a dentist in three days for the tooth extraction.  The following portions of the patient's history were reviewed and updated as appropriate: allergies, current medications, past family history, past medical history, past social history, past surgical history, and problem list.    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 Victory Brand   WISDOM TOOTH EXTRACTION         Home Medications    Prior to Admission medications   Medication Sig Start Date End Date Taking? Authorizing Provider  amoxicillin  (AMOXIL ) 875 MG tablet Take 1 tablet (875 mg total) by mouth 2 (two) times daily at 8 am and 6 pm for 7 days. 01/23/24 01/30/24 Yes Kanaan Kagawa, Lucie, FNP  aspirin  EC (ECOTRIN LOW STRENGTH) 81 MG tablet TAKE 1 TABLET  BY MOUTH DAILY . SWALLOW WHOLE 08/26/23  Yes Tysinger, Alm RAMAN, PA-C  atorvastatin  (LIPITOR) 10 MG tablet Take 1 tablet (10 mg total) by mouth daily. 08/26/23  Yes Tysinger, Alm RAMAN, PA-C  blood glucose meter kit and supplies KIT Inject 1 each into the skin as directed. Dispense based on patient and insurance preference. Use up to four times daily as directed 06/18/20  Yes Tysinger, Alm RAMAN, PA-C  Blood Glucose Monitoring Suppl DEVI Test 1-2 times day. Pend on Insurance 08/26/23  Yes Tysinger, Alm RAMAN, PA-C  chlorhexidine  (PERIDEX ) 0.12 % solution Use as directed 15 mLs in the mouth or throat 2 (two) times daily at 8 am and 6 pm for 7 days. Brush teeth then swish in mouth for 2 minutes then spit out. Do not rinse mouth after use. Avoid eating, drinking, smoking and vaping for at least 30 minutes after use 01/23/24 01/30/24 Yes Erick Oxendine, [place], FNP  Glucose Blood (BLOOD GLUCOSE TEST  STRIPS) STRP Test 1-2 times daily 08/26/23  Yes Tysinger, Alm RAMAN, PA-C  ibuprofen  (ADVIL ) 800 MG tablet Take 1 tablet (800 mg total) by mouth every 8 (eight) hours as needed (pain). Take with food to avoid stomach upset. Do not take any additional NSAIDs while on this. You may take tylenol  in addition to this if needed for extra pain relief. 01/23/24  Yes Iola Lukes, FNP  Insulin  Pen Needle (BD PEN NEEDLE NANO 2ND GEN) 32G X 4 MM MISC USE 1  AT BEDTIME 07/11/23  Yes Tysinger, Alm RAMAN, PA-C  Lancet Device MISC 1-2 times daily 08/26/23  Yes  Tysinger, Alm RAMAN, PA-C  Lancets Misc. MISC 1-2 times a day 08/26/23  Yes Tysinger, Alm RAMAN, PA-C  lisinopril -hydrochlorothiazide  (ZESTORETIC ) 20-12.5 MG tablet Take 1 tablet by mouth daily. 08/26/23  Yes Tysinger, Alm RAMAN, PA-C  metFORMIN  (GLUCOPHAGE ) 1000 MG tablet Take 1 tablet (1,000 mg total) by mouth 2 (two) times daily with a meal. 08/26/23 08/25/24 Yes Tysinger, Alm RAMAN, PA-C  SOLIQUA  100-33 UNT-MCG/ML SOPN Inject 45 Units into the skin daily. 08/26/23  Yes Tysinger, Alm RAMAN, PA-C    Family History Family History  Problem Relation Age of Onset   Arthritis Mother    Pneumonia Father    Depression Brother    Hypertension Other    Cancer Neg Hx    Stroke Neg Hx    Colon cancer Neg Hx    Colon polyps Neg Hx    Esophageal cancer Neg Hx    Rectal cancer Neg Hx    Stomach cancer Neg Hx    Heart disease Neg Hx    Diabetes Neg Hx     Social History Social History   Tobacco Use   Smoking status: Every Day    Current packs/day: 0.50    Average packs/day: 0.5 packs/day for 27.0 years (13.5 ttl pk-yrs)    Types: Cigarettes   Smokeless tobacco: Never  Vaping Use   Vaping status: Never Used  Substance Use Topics   Alcohol use: No    Alcohol/week: 0.0 standard drinks of alcohol   Drug use: No     Allergies   Patient has no known allergies.   Review of Systems Review of Systems  Constitutional:  Negative for fever.  HENT:  Positive for dental problem. Negative for ear pain, facial swelling (pain in face but no swelling) and trouble swallowing.   Neurological:  Negative for dizziness and headaches.  All other systems reviewed and are negative.    Physical Exam Triage Vital Signs ED Triage Vitals  Encounter Vitals Group     BP 01/23/24 1336 117/82     Girls Systolic BP Percentile --      Girls Diastolic BP Percentile --      Boys Systolic BP Percentile --      Boys Diastolic BP Percentile --      Pulse Rate 01/23/24 1336 74     Resp 01/23/24 1336 18     Temp  01/23/24 1336 99 F (37.2 C)     Temp Source 01/23/24 1336 Oral     SpO2 01/23/24 1336 96 %     Weight 01/23/24 1336 200 lb (90.7 kg)     Height 01/23/24 1336 5' 10 (1.778 m)     Head Circumference --      Peak Flow --      Pain Score 01/23/24 1335 7     Pain Loc --      Pain Education --  Exclude from Growth Chart --    No data found.  Updated Vital Signs BP 117/82 (BP Location: Right Arm)   Pulse 74   Temp 99 F (37.2 C) (Oral)   Resp 18   Ht 5' 10 (1.778 m)   Wt 200 lb (90.7 kg)   SpO2 96%   BMI 28.70 kg/m   Visual Acuity Right Eye Distance:   Left Eye Distance:   Bilateral Distance:    Right Eye Near:   Left Eye Near:    Bilateral Near:     Physical Exam Vitals reviewed.  Constitutional:      General: He is awake. He is not in acute distress.    Appearance: Normal appearance. He is well-developed. He is not ill-appearing, toxic-appearing or diaphoretic.  HENT:     Head: Normocephalic.     Right Ear: Hearing normal.     Left Ear: Hearing normal.     Nose: Nose normal.     Mouth/Throat:     Lips: Pink.     Mouth: Mucous membranes are moist.     Dentition: Dental tenderness and dental caries present. No gingival swelling, dental abscesses or gum lesions.     Pharynx: Oropharynx is clear. Uvula midline.   Eyes:     General: Vision grossly intact.     Conjunctiva/sclera: Conjunctivae normal.  Cardiovascular:     Rate and Rhythm: Normal rate and regular rhythm.     Heart sounds: Normal heart sounds.  Pulmonary:     Effort: Pulmonary effort is normal.     Breath sounds: Normal breath sounds and air entry.  Musculoskeletal:        General: Normal range of motion.     Cervical back: Full passive range of motion without pain, normal range of motion and neck supple.  Lymphadenopathy:     Cervical: No cervical adenopathy.  Skin:    General: Skin is warm and dry.  Neurological:     General: No focal deficit present.     Mental Status: He is alert and  oriented to person, place, and time.  Psychiatric:        Speech: Speech normal.        Behavior: Behavior is cooperative.      UC Treatments / Results  Labs (all labs ordered are listed, but only abnormal results are displayed) Labs Reviewed - No data to display  EKG   Radiology No results found.  Procedures Procedures (including critical care time)  Medications Ordered in UC Medications - No data to display  Initial Impression / Assessment and Plan / UC Course  I have reviewed the triage vital signs and the nursing notes.  Pertinent labs & imaging results that were available during my care of the patient were reviewed by me and considered in my medical decision making (see chart for details).    Patient presents with worsening dental pain involving a previously evaluated tooth that has been scheduled for extraction in three days. There is no associated swelling, ear pain, headache, or dizziness. Exam findings are consistent with localized dental infection without signs of systemic involvement. Amoxicillin  was prescribed to be taken twice daily for one week to manage any underlying infection. Peridex  mouth rinse was also prescribed, with instructions to use it twice daily by swishing and spitting without rinsing afterwards, and to avoid eating, drinking, or smoking for 30 minutes post-use. Ibuprofen  800 mg was prescribed for pain management as needed every 8 hours. Patient advised to follow  up with their dentist as scheduled and to seek earlier evaluation if symptoms worsen, new swelling develops, or signs of systemic illness such as fever or facial swelling occur. ED evaluation is recommended for severe or rapidly progressing symptoms.  Today's evaluation has revealed no signs of a dangerous process. Discussed diagnosis with patient and/or guardian. Patient and/or guardian aware of their diagnosis, possible red flag symptoms to watch out for and need for close follow up. Patient  and/or guardian understands verbal and written discharge instructions. Patient and/or guardian comfortable with plan and disposition.  Patient and/or guardian has a clear mental status at this time, good insight into illness (after discussion and teaching) and has clear judgment to make decisions regarding their care  Documentation was completed with the aid of voice recognition software. Transcription may contain typographical errors.  Final Clinical Impressions(s) / UC Diagnoses   Final diagnoses:  Dentalgia  Pain due to dental caries     Discharge Instructions      You were seen today for dental pain. Take the antibiotics exactly as prescribed to help prevent or treat any infection. Use the prescribed mouth rinse twice a day after brushing your teeth to help keep the area clean. After eating, rinse your mouth with water to remove any food particles and reduce irritation.  Take the naproxen  twice a day with food to help with pain and inflammation. Do not take any aspirin  or other over-the-counter NSAIDs while using this medication. Tylenol  can also be used along with the other medications. Take two 500 mg tablets (a total of 1000 mg) every 6 hours as needed. Do not take more than 4000 mg of Tylenol  in 24 hours.  Avoid any foods or drinks that make the pain worse. Stick to soft foods while your mouth is healing, and do not use straws, as this can delay healing.       ED Prescriptions     Medication Sig Dispense Auth. Provider   amoxicillin  (AMOXIL ) 875 MG tablet Take 1 tablet (875 mg total) by mouth 2 (two) times daily at 8 am and 6 pm for 7 days. 14 tablet Reynoldo Mainer, [place], FNP   chlorhexidine  (PERIDEX ) 0.12 % solution Use as directed 15 mLs in the mouth or throat 2 (two) times daily at 8 am and 6 pm for 7 days. Brush teeth then swish in mouth for 2 minutes then spit out. Do not rinse mouth after use. Avoid eating, drinking, smoking and vaping for at least 30 minutes after use 210 mL  Aries Kasa, Lucie, FNP   ibuprofen  (ADVIL ) 800 MG tablet Take 1 tablet (800 mg total) by mouth every 8 (eight) hours as needed (pain). Take with food to avoid stomach upset. Do not take any additional NSAIDs while on this. You may take tylenol  in addition to this if needed for extra pain relief. 21 tablet Iola Lucie, FNP      PDMP not reviewed this encounter.   Iola Lucie, [place] 01/23/24 1422

## 2024-01-23 NOTE — ED Triage Notes (Signed)
 Patient presenting with upper left side dental pain onset last night. Denies any known dental problems or trauma. Scheduled to see the dentist Monday.   Prescriptions or OTC medications tried: Yes- Ibuprofen  and oragel    with little relief

## 2024-02-27 ENCOUNTER — Institutional Professional Consult (permissible substitution) (INDEPENDENT_AMBULATORY_CARE_PROVIDER_SITE_OTHER): Payer: PRIVATE HEALTH INSURANCE | Admitting: Otolaryngology

## 2024-04-04 ENCOUNTER — Other Ambulatory Visit: Payer: Self-pay | Admitting: Medical

## 2024-04-04 ENCOUNTER — Other Ambulatory Visit: Payer: Self-pay | Admitting: Family Medicine

## 2024-04-04 MED ORDER — SOLIQUA 100-33 UNT-MCG/ML ~~LOC~~ SOPN: 45.0000 [IU] | PEN_INJECTOR | Freq: Every day | SUBCUTANEOUS | 3 refills | Status: AC

## 2024-06-10 ENCOUNTER — Other Ambulatory Visit: Payer: Self-pay | Admitting: Medical

## 2024-06-10 NOTE — Telephone Encounter (Signed)
 Appt in February 2026

## 2024-09-03 ENCOUNTER — Encounter: Payer: PRIVATE HEALTH INSURANCE | Admitting: Medical

## 2024-09-04 ENCOUNTER — Encounter: Payer: PRIVATE HEALTH INSURANCE | Admitting: Medical
